# Patient Record
Sex: Female | Born: 1985 | Hispanic: No | Smoking: Never smoker
Health system: Southern US, Community
[De-identification: ages and names within clinical notes are randomized; demographics above are authoritative.]

## PROBLEM LIST (undated history)

## (undated) DIAGNOSIS — M179 Osteoarthritis of knee, unspecified: Secondary | ICD-10-CM

## (undated) DIAGNOSIS — Z862 Personal history of diseases of the blood and blood-forming organs and certain disorders involving the immune mechanism: Secondary | ICD-10-CM

## (undated) DIAGNOSIS — M1710 Unilateral primary osteoarthritis, unspecified knee: Secondary | ICD-10-CM

## (undated) HISTORY — PX: LAPAROSCOPIC GASTRIC SLEEVE RESECTION: SHX5895

---

## 2013-09-19 DIAGNOSIS — G4733 Obstructive sleep apnea (adult) (pediatric): Secondary | ICD-10-CM

## 2018-09-06 ENCOUNTER — Encounter (HOSPITAL_COMMUNITY): Payer: Self-pay | Admitting: Advanced Practice Midwife

## 2018-09-06 ENCOUNTER — Inpatient Hospital Stay (HOSPITAL_COMMUNITY)
Admission: AD | Admit: 2018-09-06 | Discharge: 2018-09-06 | Disposition: A | Discharge status: Home / Self Care | Payer: Self-pay | Attending: Obstetrics and Gynecology | Admitting: Obstetrics and Gynecology

## 2018-09-06 ENCOUNTER — Other Ambulatory Visit: Payer: Self-pay

## 2018-09-06 DIAGNOSIS — Z975 Presence of (intrauterine) contraceptive device: Secondary | ICD-10-CM | POA: Insufficient documentation

## 2018-09-06 DIAGNOSIS — N923 Ovulation bleeding: Secondary | ICD-10-CM | POA: Insufficient documentation

## 2018-09-06 DIAGNOSIS — T8383XA Hemorrhage of genitourinary prosthetic devices, implants and grafts, initial encounter: Secondary | ICD-10-CM

## 2018-09-06 DIAGNOSIS — IMO0001 Reserved for inherently not codable concepts without codable children: Secondary | ICD-10-CM

## 2018-09-06 LAB — POCT PREGNANCY, URINE: Preg Test, Ur: NEGATIVE

## 2018-09-06 NOTE — MAU Provider Note (Signed)
  History     CSN: [id number]  Arrival date and time: 09/06/18 1648   First Provider Initiated Contact with Patient 09/06/18 1750      Chief Complaint  Patient presents with  . Pelvic Pain  . ? IUD placement   Danielle [name] is a 33 y.o. who presents today for check up on her IUD. She states that she had a normal period on 08/20/2018, and then started spotting on 09/03/2018. She cannot feel her IUD strings now. So she is worried that she has displaced her IUD. She went to a non- [facility] and they sent her here for evaluation.    OB History   No obstetric history on file.     History reviewed. No pertinent past medical history.  History reviewed. No pertinent surgical history.  History reviewed. No pertinent family history.  Social History   Tobacco Use  . Smoking status: Not on file  Substance Use Topics  . Alcohol use: Not on file  . Drug use: Not on file    Allergies: Not on File  No medications prior to admission.    Review of Systems  All other systems reviewed and are negative.  Physical Exam   Blood pressure (!) 127/57, pulse 85, temperature 98.6 F (37 C), temperature source Oral, resp. rate 18, weight (!) 141.4 kg, last menstrual period 08/15/2018, SpO2 100 %.  Physical Exam  Nursing note and vitals reviewed. Constitutional: She is oriented to person, place, and time. She appears well-developed and well-nourished. No distress.  HENT:  Head: Normocephalic.  Cardiovascular: Normal rate.  Respiratory: Effort normal.  Musculoskeletal: Normal range of motion.  Neurological: She is alert and oriented to person, place, and time.   Results for orders placed or performed during the hospital encounter of 09/06/18 (from the past 24 hour(s))  Pregnancy, urine POC     Status: None   Collection Time: 09/06/18  5:44 PM  Result Value Ref Range   Preg Test, Ur NEGATIVE NEGATIVE    MAU Course  Procedures  MDM   Assessment and Plan   1.  Intermenstrual spotting due to intrauterine device (IUD), initial encounter [facility])    Patient given info for the clinic and advised to call for an appointment so that we can evaluate her IUD and bleeding.   Marcille Buffy DNP, CNM  09/06/18  6:13 PM

## 2018-09-06 NOTE — MAU Note (Signed)
Neg preg test here.  Needs list of GYN practices.

## 2018-09-06 NOTE — MAU Note (Signed)
Sometime 2nd or 3rd wk in July.  Slipped in the tub, busted her lip, knee and hit pelvis area.  There was a small lump on the left side, but that has gone away.   Had her period, .Ended around 8/4.  About 4 days ago she started spotting. This is between cycles, never had spotting like this before. Tried to reach in and feel the strings, was unable to feel and it was uncomfortable.  Has been able to feel strings in the past.  IUD was placed in [place], just moved here beginning of July.

## 2018-09-06 NOTE — Discharge Instructions (Signed)
Abnormal Uterine Bleeding Abnormal uterine bleeding is unusual bleeding from the uterus. It includes:  Bleeding or spotting between periods.  Bleeding after sex.  Bleeding that is heavier than normal.  Periods that last longer than usual.  Bleeding after menopause. Abnormal uterine bleeding can affect women at various stages in life, including teenagers, women in their reproductive years, pregnant women, and women who have reached menopause. Common causes of abnormal uterine bleeding include:  Pregnancy.  Growths of tissue (polyps).  A noncancerous tumor in the uterus (fibroid).  Infection.  Cancer.  Hormonal imbalances. Any type of abnormal bleeding should be evaluated by a health care provider. Many cases are minor and simple to treat, while others are more serious. Treatment will depend on the cause of the bleeding. Follow these instructions at home:  Monitor your condition for any changes.  Do not use tampons, douche, or have sex if told by your health care provider.  Change your pads often.  Get regular exams that include pelvic exams and cervical cancer screening.  Keep all follow-up visits as told by your health care provider. This is important. Contact a health care provider if:  Your bleeding lasts for more than one week.  You feel dizzy at times.  You feel nauseous or you vomit. Get help right away if:  You pass out.  Your bleeding soaks through a pad every hour.  You have abdominal pain.  You have a fever.  You become sweaty or weak.  You pass large blood clots from your vagina. Summary  Abnormal uterine bleeding is unusual bleeding from the uterus.  Any type of abnormal bleeding should be evaluated by a health care provider. Many cases are minor and simple to treat, while others are more serious.  Treatment will depend on the cause of the bleeding. This information is not intended to replace advice given to you by your health care provider.  Make sure you discuss any questions you have with your health care provider. Document Released: 01/02/2005 Document Revised: 04/11/2017 Document Reviewed: 02/04/2016 Elsevier Patient Education  2020 Elsevier Inc.  

## 2018-09-09 ENCOUNTER — Telehealth: Payer: Self-pay | Admitting: Advanced Practice Midwife

## 2018-09-09 NOTE — Telephone Encounter (Signed)
Patient called in stating she needs an appointment to be seen. Patient stated she went to MAU but was not able to be seen because it was for pregnant women. Patient stated she was an IUD that she can feel since she had a fall in the shower. Spoke with Nira Conn who seen the patient in MAU and she stated that the appointment can be scheduled with out next available. Patient was scheduled for 9/17 @ 8:15. Patient verbalized understanding.

## 2018-10-02 ENCOUNTER — Telehealth: Payer: Self-pay | Admitting: Obstetrics & Gynecology

## 2018-10-02 ENCOUNTER — Telehealth: Payer: Self-pay | Admitting: *Deleted

## 2018-10-02 NOTE — Telephone Encounter (Signed)
Called the patient to inform of the upcoming appointment. Left a detailed voicemail inform the patient if she has been diagnosed with covid, in close contact with someone who has had covid, or experienced any flu-like symptoms in the past 14 days please give our office a call to reschedule. Also notified due to Covid19 restriction no children or visitors are allowed. °

## 2018-10-03 ENCOUNTER — Encounter: Payer: Self-pay | Admitting: Obstetrics & Gynecology

## 2018-10-03 NOTE — Telephone Encounter (Signed)
Opened in error

## 2019-02-05 ENCOUNTER — Other Ambulatory Visit: Payer: Self-pay

## 2019-02-05 ENCOUNTER — Emergency Department (HOSPITAL_COMMUNITY)
Admission: EM | Admit: 2019-02-05 | Discharge: 2019-02-06 | Discharge status: Against Medical Advice | Payer: Medicaid Other | Attending: Certified Nurse Midwife | Admitting: Certified Nurse Midwife

## 2019-02-05 ENCOUNTER — Encounter (HOSPITAL_COMMUNITY): Payer: Self-pay | Admitting: Emergency Medicine

## 2019-02-05 DIAGNOSIS — O99011 Anemia complicating pregnancy, first trimester: Secondary | ICD-10-CM | POA: Insufficient documentation

## 2019-02-05 DIAGNOSIS — O209 Hemorrhage in early pregnancy, unspecified: Secondary | ICD-10-CM | POA: Diagnosis not present

## 2019-02-05 DIAGNOSIS — D560 Alpha thalassemia: Secondary | ICD-10-CM | POA: Insufficient documentation

## 2019-02-05 DIAGNOSIS — Z30432 Encounter for removal of intrauterine contraceptive device: Secondary | ICD-10-CM | POA: Diagnosis not present

## 2019-02-05 DIAGNOSIS — O3680X0 Pregnancy with inconclusive fetal viability, not applicable or unspecified: Secondary | ICD-10-CM | POA: Insufficient documentation

## 2019-02-05 DIAGNOSIS — Z3A01 Less than 8 weeks gestation of pregnancy: Secondary | ICD-10-CM | POA: Diagnosis not present

## 2019-02-05 LAB — BASIC METABOLIC PANEL
Anion gap: 7 (ref 5–15)
BUN: 10 mg/dL (ref 6–20)
CO2: 27 mmol/L (ref 22–32)
Calcium: 8.9 mg/dL (ref 8.9–10.3)
Chloride: 103 mmol/L (ref 98–111)
Creatinine, Ser: 0.7 mg/dL (ref 0.44–1.00)
GFR calc Af Amer: >60 mL/min (ref >60)
GFR calc non Af Amer: >60 mL/min (ref >60)
Glucose, Bld: 89 mg/dL (ref 70–99)
Potassium: 4.5 mmol/L (ref 3.5–5.1)
Sodium: 137 mmol/L (ref 135–145)

## 2019-02-05 LAB — CBC WITH DIFFERENTIAL/PLATELET
Abs Immature Granulocytes: 0.03 10*3/uL (ref 0.00–0.07)
Basophils Absolute: 0.1 10*3/uL (ref 0.0–0.1)
Basophils Relative: 1 %
Eosinophils Absolute: 0 10*3/uL (ref 0.0–0.5)
Eosinophils Relative: 1 %
HCT: 31.8 % — ABNORMAL LOW (ref 36.0–46.0)
Hemoglobin: 9.4 g/dL — ABNORMAL LOW (ref 12.0–15.0)
Immature Granulocytes: 0 %
Lymphocytes Relative: 19 %
Lymphs Abs: 1.7 10*3/uL (ref 0.7–4.0)
MCH: 24 pg — ABNORMAL LOW (ref 26.0–34.0)
MCHC: 29.6 g/dL — ABNORMAL LOW (ref 30.0–36.0)
MCV: 81.1 fL (ref 80.0–100.0)
Monocytes Absolute: 0.7 10*3/uL (ref 0.1–1.0)
Monocytes Relative: 8 %
Neutro Abs: 6.1 10*3/uL (ref 1.7–7.7)
Neutrophils Relative %: 71 %
Platelets: 310 10*3/uL (ref 150–400)
RBC: 3.92 MIL/uL (ref 3.87–5.11)
RDW: 12.5 % (ref 11.5–15.5)
WBC: 8.5 10*3/uL (ref 4.0–10.5)
nRBC: 0 % (ref 0.0–0.2)

## 2019-02-05 LAB — I-STAT BETA HCG BLOOD, ED (MC, WL, AP ONLY): I-stat hCG, quantitative: 195.6 m[IU]/mL — ABNORMAL HIGH (ref <5)

## 2019-02-05 NOTE — ED Triage Notes (Signed)
Patient requesting her IUD removed due to intermittent spotting since last year , unknown LMP , patient added chronic left knee pain for several years /ambulatory .

## 2019-02-06 ENCOUNTER — Inpatient Hospital Stay (HOSPITAL_COMMUNITY): Payer: Medicaid Other

## 2019-02-06 ENCOUNTER — Other Ambulatory Visit: Payer: Self-pay

## 2019-02-06 ENCOUNTER — Inpatient Hospital Stay (EMERGENCY_DEPARTMENT_HOSPITAL)
Admission: AD | Admit: 2019-02-06 | Discharge: 2019-02-06 | Disposition: A | Discharge status: Home / Self Care | Payer: Medicaid Other | Source: Home / Self Care | Attending: Obstetrics and Gynecology | Admitting: Obstetrics and Gynecology

## 2019-02-06 ENCOUNTER — Encounter (HOSPITAL_COMMUNITY): Payer: Self-pay | Admitting: Obstetrics and Gynecology

## 2019-02-06 DIAGNOSIS — Z3A01 Less than 8 weeks gestation of pregnancy: Secondary | ICD-10-CM | POA: Diagnosis not present

## 2019-02-06 DIAGNOSIS — O2630 Retained intrauterine contraceptive device in pregnancy, unspecified trimester: Secondary | ICD-10-CM

## 2019-02-06 DIAGNOSIS — O3680X0 Pregnancy with inconclusive fetal viability, not applicable or unspecified: Secondary | ICD-10-CM

## 2019-02-06 DIAGNOSIS — O209 Hemorrhage in early pregnancy, unspecified: Secondary | ICD-10-CM

## 2019-02-06 DIAGNOSIS — Z30432 Encounter for removal of intrauterine contraceptive device: Secondary | ICD-10-CM | POA: Diagnosis not present

## 2019-02-06 DIAGNOSIS — Z6791 Unspecified blood type, Rh negative: Secondary | ICD-10-CM

## 2019-02-06 DIAGNOSIS — Z331 Pregnant state, incidental: Secondary | ICD-10-CM

## 2019-02-06 DIAGNOSIS — O99011 Anemia complicating pregnancy, first trimester: Secondary | ICD-10-CM

## 2019-02-06 DIAGNOSIS — O26891 Other specified pregnancy related conditions, first trimester: Secondary | ICD-10-CM

## 2019-02-06 DIAGNOSIS — T8331XA Breakdown (mechanical) of intrauterine contraceptive device, initial encounter: Secondary | ICD-10-CM

## 2019-02-06 HISTORY — DX: Osteoarthritis of knee, unspecified: M17.9

## 2019-02-06 HISTORY — DX: Personal history of diseases of the blood and blood-forming organs and certain disorders involving the immune mechanism: Z86.2

## 2019-02-06 HISTORY — DX: Unilateral primary osteoarthritis, unspecified knee: M17.10

## 2019-02-06 LAB — URINALYSIS, ROUTINE W REFLEX MICROSCOPIC
Bilirubin Urine: NEGATIVE
Glucose, UA: NEGATIVE mg/dL
Hgb urine dipstick: NEGATIVE
Ketones, ur: NEGATIVE mg/dL
Leukocytes,Ua: NEGATIVE
Nitrite: NEGATIVE
Protein, ur: NEGATIVE mg/dL
Specific Gravity, Urine: 1.01 (ref 1.005–1.030)
pH: 6 (ref 5.0–8.0)

## 2019-02-06 LAB — HIV ANTIBODY (ROUTINE TESTING W REFLEX): HIV Screen 4th Generation wRfx: NONREACTIVE

## 2019-02-06 LAB — WET PREP, GENITAL
Sperm: NONE SEEN
Trich, Wet Prep: NONE SEEN
Yeast Wet Prep HPF POC: NONE SEEN

## 2019-02-06 LAB — HCG, QUANTITATIVE, PREGNANCY: hCG, Beta Chain, Quant, S: 247 m[IU]/mL — ABNORMAL HIGH (ref <5)

## 2019-02-06 LAB — ABO/RH: ABO/RH(D): B NEG

## 2019-02-06 MED ORDER — RHO D IMMUNE GLOBULIN 1500 UNIT/2ML IJ SOSY
300.0000 ug | PREFILLED_SYRINGE | Freq: Once | INTRAMUSCULAR | Status: AC
  Administered 2019-02-06: 09:00:00 300 ug via INTRAMUSCULAR
  Filled 2019-02-06: qty 2

## 2019-02-06 NOTE — Discharge Instructions (Signed)
Vaginal Bleeding During Pregnancy, First Trimester  A small amount of bleeding from the vagina (spotting) is relatively common during early pregnancy. It usually stops on its own. Various things may cause bleeding or spotting during early pregnancy. Some bleeding may be related to the pregnancy, and some may not. In many cases, the bleeding is normal and is not a problem. However, bleeding can also be a sign of something serious. Be sure to tell your health care provider about any vaginal bleeding right away. Some possible causes of vaginal bleeding during the first trimester include:  Infection or inflammation of the cervix.  Growths (polyps) on the cervix.  Miscarriage or threatened miscarriage.  Pregnancy tissue developing outside of the uterus (ectopic pregnancy).  A mass of tissue developing in the uterus due to an egg being fertilized incorrectly (molar pregnancy). Follow these instructions at home: Activity  Follow instructions from your health care provider about limiting your activity. Ask what activities are safe for you.  If needed, make plans for someone to help with your regular activities.  Do not have sex or orgasms until your health care provider says that this is safe. General instructions  Take over-the-counter and prescription medicines only as told by your health care provider.  Pay attention to any changes in your symptoms.  Do not use tampons or douche.  Write down how many pads you use each day, how often you change pads, and how soaked (saturated) they are.  If you pass any tissue from your vagina, save the tissue so you can show it to your health care provider.  Keep all follow-up visits as told by your health care provider. This is important. Contact a health care provider if:  You have vaginal bleeding during any part of your pregnancy.  You have cramps or labor pains.  You have a fever. Get help right away if:  You have severe cramps in your  back or abdomen.  You pass large clots or a large amount of tissue from your vagina.  Your bleeding increases.  You feel light-headed or weak, or you faint.  You have chills.  You are leaking fluid or have a gush of fluid from your vagina. Summary  A small amount of bleeding (spotting) from the vagina is relatively common during early pregnancy.  Various things may cause bleeding or spotting in early pregnancy.  Be sure to tell your health care provider about any vaginal bleeding right away. This information is not intended to replace advice given to you by your health care provider. Make sure you discuss any questions you have with your health care provider. Document Revised: 04/23/2018 Document Reviewed: 04/06/2016 Elsevier Patient Education  2020 Elsevier Inc.  

## 2019-02-06 NOTE — MAU Provider Note (Addendum)
Chief Complaint: pregnancy with iud   First Provider Initiated Contact with Patient 02/06/19 0703        SUBJECTIVE HPI: Danielle [name] is a 34 y.o. [id number] at [redacted]w[redacted]d by LMP who presents to maternity admissions reporting positive pregnancy test and bleeding off and on with presence of Paragard IUD.  Wants it removed, has appt for February.  Was planning pregnancy in February anyway. She denies vaginal itching/burning, urinary symptoms, h/a, dizziness, n/v, or fever/chills.    Vaginal Bleeding The patient's primary symptoms include vaginal bleeding. The patient's pertinent negatives include no genital itching, genital lesions, genital odor or pelvic pain. This is a new problem. The current episode started 1 to 4 weeks ago. The problem occurs intermittently. The problem has been unchanged. The patient is experiencing no pain. She is pregnant. Pertinent negatives include no abdominal pain, chills, constipation, diarrhea, fever, nausea or vomiting. The vaginal discharge was bloody. The vaginal bleeding is spotting. She has not been passing clots. She has not been passing tissue. Nothing aggravates the symptoms. She has tried nothing for the symptoms.   RN note: Was seen in ED last night and did blood work but left due to wait. Did not know blood work showed I was pregnant. I did upt last night and was postive. Have IUD in which is why I went to ED. Has been irritating me and wanted it out. Have paragard in and usually have periods. LMP 12/14 and was normal. Spotting since 12/31 and had something like light period 1/12. Have had IUD about 45months. Placed in [place], [place]  Past Medical History:  Diagnosis Date  . Degenerative arthritis of knee    left   . Hx of alpha thalassemia    Past Surgical History:  Procedure Laterality Date  . CESAREAN SECTION    . LAPAROSCOPIC GASTRIC SLEEVE RESECTION     Social History   Socioeconomic History  . Marital status: Single    Spouse name: Not on file  .  Number of children: Not on file  . Years of education: Not on file  . Highest education level: Not on file  Occupational History  . Not on file  Tobacco Use  . Smoking status: Never Smoker  . Smokeless tobacco: Never Used  Substance and Sexual Activity  . Alcohol use: Never  . Drug use: Never  . Sexual activity: Never  Other Topics Concern  . Not on file  Social History Narrative  . Not on file   Social Determinants of Health   Financial Resource Strain:   . Difficulty of Paying Living Expenses: Not on file  Food Insecurity:   . Worried About Charity fundraiser in the Last Year: Not on file  . Ran Out of Food in the Last Year: Not on file  Transportation Needs:   . Lack of Transportation (Medical): Not on file  . Lack of Transportation (Non-Medical): Not on file  Physical Activity:   . Days of Exercise per Week: Not on file  . Minutes of Exercise per Session: Not on file  Stress:   . Feeling of Stress : Not on file  Social Connections:   . Frequency of Communication with Friends and Family: Not on file  . Frequency of Social Gatherings with Friends and Family: Not on file  . Attends Religious Services: Not on file  . Active Member of Clubs or Organizations: Not on file  . Attends Archivist Meetings: Not on file  . Marital Status: Not  on file  Intimate Partner Violence:   . Fear of Current or Ex-Partner: Not on file  . Emotionally Abused: Not on file  . Physically Abused: Not on file  . Sexually Abused: Not on file   No current facility-administered medications on file prior to encounter.   Current Outpatient Medications on File Prior to Encounter  Medication Sig Dispense Refill  . acetaminophen (TYLENOL) 500 MG tablet Take 500 mg by mouth every 6 (six) hours as needed.    . Prenatal Vit-Fe Fumarate-FA (PRENATAL MULTIVITAMIN) TABS tablet Take 1 tablet by mouth daily at 12 noon.     No Known Allergies  I have reviewed patient's Past Medical Hx, Surgical  Hx, Family Hx, Social Hx, medications and allergies.   ROS:  Review of Systems  Constitutional: Negative for chills and fever.  Gastrointestinal: Negative for abdominal pain, constipation, diarrhea, nausea and vomiting.  Genitourinary: Positive for vaginal bleeding. Negative for pelvic pain.   Review of Systems  Other systems negative   Physical Exam  Physical Exam Patient Vitals for the past 24 hrs:  BP Temp Temp src Pulse Resp Height Weight  02/06/19 0703 (!) 100/46 -- -- 77 -- -- --  02/06/19 0701 -- 98.1 F (36.7 C) Oral -- 18 -- --  02/06/19 0630 124/73 -- -- 83 -- -- --  02/06/19 0628 -- 98.6 F (37 C) -- -- 18 -- --  02/06/19 7253 -- -- -- -- -- 5\' 4"  (1.626 m) (!) 147 kg   Constitutional: Well-developed, well-nourished female in no acute distress.  Cardiovascular: normal rate Respiratory: normal effort GI: Abd soft, non-tender. Pos BS x 4 MS: Extremities nontender, no edema, normal ROM Neurologic: Alert and oriented x 4.  GU: Neg CVAT.  PELVIC EXAM: Blind swabs done, pelvic exam deferred for now as she is on the way to department   LAB RESULTS  --/--/B NEG Performed at [facility], [address], [place], [place] , B NEG (01/21 0644)  IMAGING 08-17-1998 OB Comp Less 14 Wks  Result Date: 02/06/2019 CLINICAL DATA:  Vaginal bleeding.  Positive beta HCG EXAM: OBSTETRIC <14 WK 02/08/2019 AND TRANSVAGINAL OB US TECHNIQUE: Both transabdominal and transvaginal ultrasound examinations were performed for complete evaluation of the gestation as well as the maternal uterus, adnexal regions, and pelvic cul-de-sac. Transvaginal technique was performed to assess early pregnancy. COMPARISON:  None. FINDINGS: Intrauterine gestational sac: Probable tiny gestational sac within the endometrium Yolk sac:  Not visualized Embryo:  Not visualized Cardiac Activity: Not visualized MSD: Just over 1 mm consistent with a less than [redacted] week gestation Subchorionic hemorrhage:  None  visualized. Maternal uterus/adnexae: Intrauterine device is positioned at the uterus-cervix junction. Atrium is mildly thickened. Right ovary measures 2.7 x 2.3 x 1.8 cm. Left ovary measures 2.2 x 5.7 x 3.5 cm. No free pelvic fluid. IMPRESSION: 1. Probable early intrauterine gestational sac, but no yolk sac, fetal pole, or cardiac activity yet visualized. Recommend follow-up quantitative B-HCG levels and follow-up US in 14 days to assess viability. This recommendation follows SRU consensus guidelines: Diagnostic Criteria for Nonviable Pregnancy Early in the First Trimester. [place] Med 20132014. Based on suspected gestational sac size, estimated gestational age is less than 5 weeks. 2. Intrauterine device at the uterus-cervix junction. Endometrium mildly thickened. 3.  No appreciable extrauterine pelvic mass or free fluid. Electronically Signed   By: ; [id number] III M.D.   On: 02/06/2019 08:02   02/08/2019 OB Transvaginal  Result Date:  02/06/2019 CLINICAL DATA:  Vaginal bleeding.  Positive beta HCG EXAM: OBSTETRIC <14 WK [place] AND TRANSVAGINAL OB US TECHNIQUE: Both transabdominal and transvaginal ultrasound examinations were performed for complete evaluation of the gestation as well as the maternal uterus, adnexal regions, and pelvic cul-de-sac. Transvaginal technique was performed to assess early pregnancy. COMPARISON:  None. FINDINGS: Intrauterine gestational sac: Probable tiny gestational sac within the endometrium Yolk sac:  Not visualized Embryo:  Not visualized Cardiac Activity: Not visualized MSD: Just over 1 mm consistent with a less than [redacted] week gestation Subchorionic hemorrhage:  None visualized. Maternal uterus/adnexae: Intrauterine device is positioned at the uterus-cervix junction. Atrium is mildly thickened. Right ovary measures 2.7 x 2.3 x 1.8 cm. Left ovary measures 2.2 x 5.7 x 3.5 cm. No free pelvic fluid. IMPRESSION: 1. Probable early intrauterine gestational sac, but no yolk sac, fetal  pole, or cardiac activity yet visualized. Recommend follow-up quantitative B-HCG levels and follow-up US in 14 days to assess viability. This recommendation follows SRU consensus guidelines: Diagnostic Criteria for Nonviable Pregnancy Early in the First Trimester. Malva Limes Med 2013; 035:5974-16. Based on suspected gestational sac size, estimated gestational age is less than 5 weeks. 2. Intrauterine device at the uterus-cervix junction. Endometrium mildly thickened. 3.  No appreciable extrauterine pelvic mass or free fluid. Electronically Signed   By: Bretta Bang III M.D.   On: 02/06/2019 08:02   MAU Management/MDM: Ordered usual first trimester r/o ectopic labs.   Pelvic exam and cultures done Will check baseline Ultrasound to rule out ectopic and locate IUD.  This bleeding/pain can represent a normal pregnancy with bleeding, spontaneous abortion or even an ectopic which can be life-threatening.  The process as listed above helps to determine which of these is present.  Report given to oncoming provider Patient wants IUD pulled  Wynelle Bourgeois CNM, MSN Certified Nurse-Midwife 02/06/2019  9:26 AM   Assumed care of patient. IUGS seen on [place] but no YS or FP, findings likely indicate early pregnancy, but ectopic pregnancy or failed pregnancy cannot be r/o-discussed with pt. Will follow quant in 48 hrs. Pt desires IUD removal and I would recommend IUD removal since it's malplaced in LUS. Rhogam given.  Procedure Note: Pt consented for IUD removal. Pt placed in lithotomy position. Speculum inserted, cervix and IUD strings visualized. Strings grasped with long kelly and Paragard IUD removed intact without difficulty. Tolerated well.  A/P: Pregnancy of unknown location IUD in pregnancy IUD removal Rh negative Anemia in pregnancy  Discharge home Follow up at MAU on 1/23 for stat qhcg Ectopic/SAB precautions  Allergies as of 02/06/2019   No Known Allergies     Medication List     TAKE these medications   acetaminophen 500 MG tablet Commonly known as: TYLENOL Take 500 mg by mouth every 6 (six) hours as needed.   prenatal multivitamin Tabs tablet Take 1 tablet by mouth daily at 12 noon.       Donette Larry, CNM  02/06/2019 9:26 AM

## 2019-02-06 NOTE — MAU Note (Signed)
Was seen in ED last night and did blood work but left due to wait. Did not know blood work showed I was pregnant. I did upt last night and was postive. Have IUD in which is why I went to ED. Has been irritating me and wanted it out. Have paragard in and usually have periods. LMP 12/14 and was normal. Spotting since 12/31 and had something like light period 1/12. Have had IUD about 44months. Placed in [place], [place]

## 2019-02-07 LAB — RH IG WORKUP (INCLUDES ABO/RH)
ABO/RH(D): B NEG
Antibody Screen: NEGATIVE
Gestational Age(Wks): 2
Unit division: 0

## 2019-02-07 LAB — GC/CHLAMYDIA PROBE AMP (~~LOC~~) NOT AT ARMC
Chlamydia: NEGATIVE
Comment: NEGATIVE
Comment: NORMAL
Neisseria Gonorrhea: NEGATIVE

## 2019-02-08 ENCOUNTER — Inpatient Hospital Stay (HOSPITAL_COMMUNITY)
Admission: AD | Admit: 2019-02-08 | Discharge: 2019-02-08 | Disposition: A | Discharge status: Home / Self Care | Payer: Medicaid Other | Attending: Family Medicine | Admitting: Family Medicine

## 2019-02-08 ENCOUNTER — Other Ambulatory Visit: Payer: Self-pay

## 2019-02-08 DIAGNOSIS — O3680X0 Pregnancy with inconclusive fetal viability, not applicable or unspecified: Secondary | ICD-10-CM | POA: Insufficient documentation

## 2019-02-08 LAB — HCG, QUANTITATIVE, PREGNANCY: hCG, Beta Chain, Quant, S: 389 m[IU]/mL — ABNORMAL HIGH (ref <5)

## 2019-02-08 NOTE — MAU Provider Note (Signed)
Ms. Danielle [name]  is a 34 y.o. [phone] at unknown who presents to MAU today for follow-up quant hCG after 48 hours. The patient was seen in MAU on 02/06/19 and had quant hCG of 247 and US showed IUGS but no YS or FP. IUD was also seen and malposition therefore removed. She denies pain and reports small amt of pink spotting.   OB History  Gravida Para Term Preterm AB Living  4 2 1 1 1 2   SAB TAB Ectopic Multiple Live Births  1       2    # Outcome Date GA Lbr Len/2nd Weight Sex Delivery Anes PTL Lv  4 Current           3 Term      CS-Unspec   LIV  2 Preterm      CS-Unspec   LIV  1 SAB             Obstetric Comments  2nd pregnancy delivered at 36 weeks  Prior deliveries in     Past Medical History:  Diagnosis Date  . Degenerative arthritis of knee    left   . Hx of alpha thalassemia     ROS: + VB no pain  BP 124/68 (BP Location: Right Arm)   Pulse 81   Temp 98.3 F (36.8 C) (Oral)   Resp 16   LMP 01/28/2019   SpO2 100% Comment: room air  CONSTITUTIONAL: Well-developed, well-nourished female in no acute distress.  MUSCULOSKELETAL: Normal range of motion.  CARDIOVASCULAR: Regular heart rate RESPIRATORY: Normal effort NEUROLOGICAL: Alert and oriented to person, place, and time.  SKIN: Not diaphoretic. No erythema. No pallor. PSYCH: Normal mood and affect. Normal behavior. Normal judgment and thought content.  Results for orders placed or performed during the hospital encounter of 02/08/19 (from the past 24 hour(s))  hCG, quantitative, pregnancy     Status: Abnormal   Collection Time: 02/08/19  9:34 AM  Result Value Ref Range   hCG, Beta Chain, Quant, S 389 (H) <5 mIU/mL    MDM: Almost 60% rise in quant. Not having pain. Will repeat in 2 days. Stable for discharge home.   A: 1. Pregnancy, location unknown    P: Discharge home First trimester/ectopic precautions discussed Patient will return for follow-up quant HCG in [facility] on 02/10/19  @0900  Patient may return to MAU as needed or if her condition were to change or worsen   02/12/19, 02/08/2019 11:12 AM

## 2019-02-08 NOTE — MAU Note (Signed)
Danielle [name] is a 34 y.o. at [redacted]w[redacted]d here in MAU reporting: here for follow up hcg. Denies pain. Having some pink spotting when she wipes. Had some nausea yesterday.  Pain score: 0/10  Vitals:   02/08/19 0839  BP: 124/68  Pulse: 81  Resp: 16  Temp: 98.3 F (36.8 C)  SpO2: 100%     Lab orders placed from triage: hcg order released

## 2019-02-08 NOTE — Discharge Instructions (Signed)

## 2019-02-10 ENCOUNTER — Ambulatory Visit (INDEPENDENT_AMBULATORY_CARE_PROVIDER_SITE_OTHER): Payer: Medicaid Other | Admitting: General Practice

## 2019-02-10 ENCOUNTER — Inpatient Hospital Stay (HOSPITAL_COMMUNITY)
Admission: AD | Admit: 2019-02-10 | Discharge: 2019-02-10 | Disposition: A | Discharge status: Home / Self Care | Payer: Medicaid Other | Attending: Obstetrics & Gynecology | Admitting: Obstetrics & Gynecology

## 2019-02-10 ENCOUNTER — Other Ambulatory Visit: Payer: Self-pay

## 2019-02-10 DIAGNOSIS — O3680X0 Pregnancy with inconclusive fetal viability, not applicable or unspecified: Secondary | ICD-10-CM | POA: Diagnosis not present

## 2019-02-10 DIAGNOSIS — R110 Nausea: Secondary | ICD-10-CM | POA: Diagnosis not present

## 2019-02-10 DIAGNOSIS — O008 Other ectopic pregnancy without intrauterine pregnancy: Secondary | ICD-10-CM | POA: Diagnosis not present

## 2019-02-10 DIAGNOSIS — O009 Unspecified ectopic pregnancy without intrauterine pregnancy: Secondary | ICD-10-CM | POA: Diagnosis present

## 2019-02-10 DIAGNOSIS — O26891 Other specified pregnancy related conditions, first trimester: Secondary | ICD-10-CM | POA: Diagnosis not present

## 2019-02-10 DIAGNOSIS — Z3A01 Less than 8 weeks gestation of pregnancy: Secondary | ICD-10-CM | POA: Insufficient documentation

## 2019-02-10 DIAGNOSIS — O2630 Retained intrauterine contraceptive device in pregnancy, unspecified trimester: Secondary | ICD-10-CM

## 2019-02-10 LAB — CBC WITH DIFFERENTIAL/PLATELET
Abs Immature Granulocytes: 0.02 10*3/uL (ref 0.00–0.07)
Basophils Absolute: 0 10*3/uL (ref 0.0–0.1)
Basophils Relative: 1 %
Eosinophils Absolute: 0.1 10*3/uL (ref 0.0–0.5)
Eosinophils Relative: 1 %
HCT: 30.2 % — ABNORMAL LOW (ref 36.0–46.0)
Hemoglobin: 9.4 g/dL — ABNORMAL LOW (ref 12.0–15.0)
Immature Granulocytes: 0 %
Lymphocytes Relative: 24 %
Lymphs Abs: 2 10*3/uL (ref 0.7–4.0)
MCH: 24.6 pg — ABNORMAL LOW (ref 26.0–34.0)
MCHC: 31.1 g/dL (ref 30.0–36.0)
MCV: 79.1 fL — ABNORMAL LOW (ref 80.0–100.0)
Monocytes Absolute: 0.7 10*3/uL (ref 0.1–1.0)
Monocytes Relative: 9 %
Neutro Abs: 5.3 10*3/uL (ref 1.7–7.7)
Neutrophils Relative %: 65 %
Platelets: 286 10*3/uL (ref 150–400)
RBC: 3.82 MIL/uL — ABNORMAL LOW (ref 3.87–5.11)
RDW: 12.6 % (ref 11.5–15.5)
WBC: 8.1 10*3/uL (ref 4.0–10.5)
nRBC: 0 % (ref 0.0–0.2)

## 2019-02-10 LAB — COMPREHENSIVE METABOLIC PANEL
ALT: 12 U/L (ref 0–44)
AST: 16 U/L (ref 15–41)
Albumin: 3.2 g/dL — ABNORMAL LOW (ref 3.5–5.0)
Alkaline Phosphatase: 45 U/L (ref 38–126)
Anion gap: 8 (ref 5–15)
BUN: 6 mg/dL (ref 6–20)
CO2: 28 mmol/L (ref 22–32)
Calcium: 9.4 mg/dL (ref 8.9–10.3)
Chloride: 102 mmol/L (ref 98–111)
Creatinine, Ser: 0.58 mg/dL (ref 0.44–1.00)
GFR calc Af Amer: >60 mL/min (ref >60)
GFR calc non Af Amer: >60 mL/min (ref >60)
Glucose, Bld: 83 mg/dL (ref 70–99)
Potassium: 4.1 mmol/L (ref 3.5–5.1)
Sodium: 138 mmol/L (ref 135–145)
Total Bilirubin: 0.3 mg/dL (ref 0.3–1.2)
Total Protein: 6.5 g/dL (ref 6.5–8.1)

## 2019-02-10 LAB — BETA HCG QUANT (REF LAB): hCG Quant: 513 m[IU]/mL

## 2019-02-10 MED ORDER — METHOTREXATE FOR ECTOPIC PREGNANCY
50.0000 mg/m2 | Freq: Once | INTRAMUSCULAR | Status: AC
  Administered 2019-02-10: 130 mg via INTRAMUSCULAR
  Filled 2019-02-10: qty 2

## 2019-02-10 NOTE — MAU Provider Note (Signed)
History     CSN: [id number]  Arrival date and time: 02/10/19 1405  CC;  Comes to MAU for methotrexate injection    Chief Complaint  Patient presents with  . methotrexate   HPI Danielle [name] 34 y.o. [redacted]w[redacted]d Dr. Adrian Blackwater called report to provider from the office.  He has talked with the patient and she is coming in for inappropriately rising quants and will get methotrexate.  OB History    Gravida  4   Para  2   Term  1   Preterm  1   AB  1   Living  2     SAB  1   TAB      Ectopic      Multiple      Live Births  2        Obstetric Comments  2nd pregnancy delivered at 56 weeks Prior deliveries in [place]         Past Medical History:  Diagnosis Date  . Degenerative arthritis of knee    left   . Hx of alpha thalassemia     Past Surgical History:  Procedure Laterality Date  . CESAREAN SECTION    . LAPAROSCOPIC GASTRIC SLEEVE RESECTION      Family History  Problem Relation Age of Onset  . Leukemia Mother   . Renal Disease Mother   . Diabetes Father   . Hypertension Father   . Prostate cancer Father   . Dementia Father     Social History   Tobacco Use  . Smoking status: Never Smoker  . Smokeless tobacco: Never Used  Substance Use Topics  . Alcohol use: Never  . Drug use: Never    Allergies: No Known Allergies  Medications Prior to Admission  Medication Sig Dispense Refill Last Dose  . acetaminophen (TYLENOL) 500 MG tablet Take 500 mg by mouth every 6 (six) hours as needed.     . Prenatal Vit-Fe Fumarate-FA (PRENATAL MULTIVITAMIN) TABS tablet Take 1 tablet by mouth daily at 12 noon.       Review of Systems  Constitutional: Negative for fever.  Respiratory: Negative for cough and shortness of breath.   Gastrointestinal: Negative for abdominal pain, nausea and vomiting.  Genitourinary: Negative for dysuria, vaginal bleeding and vaginal discharge.   Physical Exam   Blood pressure 135/68, pulse 81, temperature 98.4 F  (36.9 C), temperature source Oral, resp. rate 18, height 5\' 4"  (1.626 m), weight (!) 148.3 kg, last menstrual period 01/28/2019.  Physical Exam  Nursing note and vitals reviewed. Constitutional: She is oriented to person, place, and time. She appears well-developed and well-nourished.  HENT:  Head: Normocephalic.  Eyes: EOM are normal.  Cardiovascular: Normal rate.  Respiratory: Effort normal.  Musculoskeletal:        General: Normal range of motion.     Cervical back: Neck supple.  Neurological: She is alert and oriented to person, place, and time.  Skin: Skin is warm and dry.  Psychiatric: She has a normal mood and affect.    MAU Course  Procedures  MDM Reviewed diagnosis of inappropriate rise of BHCG and treatment of ectopic pregnancy.  Client agrees to methotrexate as treatment.  Labs drawn and reviewed.  Methotrexate ordered.  Assessment and Plan  Ectopic pregnancy treated with methotrexate  Plan Will follow up for day 4 BHCG at [facility] office and appointment made. Will follow up for Day 7 BHCG at MAU - instructions given to client to come in the morning  on Friday. Advised she will need weekly quants at the [facility] office after these to follow pregnancy hormone levels to zero. Pelvic rest reviewed with client and note to be out of work until Friday given.   L  02/10/2019, 4:18 PM

## 2019-02-10 NOTE — Progress Notes (Signed)
Discussed patient's abnormally rising HCG. Recommended Mtx. Patient tearful, but okay with injection. Will send to MAU - MAU notified. F/u on Thursday for rpt HCG. Please schedule.

## 2019-02-10 NOTE — Progress Notes (Signed)
Patient presents to office today for stat bhcg following up from MAU visits on 1/21 & 1/23. Patient reports spotting stopped shortly after IUD was removed. Denies pain today as well. Discussed with patient we are monitoring your bhcg levels today, results take approximately 2 hours to finalize and will be reviewed with a provider in the office- we will then call you with results/updated plan of care. Patient verbalized understanding and provided callback numbers [phone] and [phone].

## 2019-02-10 NOTE — MAU Note (Signed)
Sent in for Methotrexate.  Inappropriate rise in HCG

## 2019-02-10 NOTE — MAU Note (Signed)
Is tired, breasts are hurting and is nauseous.  Is peeing so much, no pain just noted increased frequency.  Saw a little pink when wiped once today, had a couple cramps earlier, none since.

## 2019-02-10 NOTE — Discharge Instructions (Signed)
Methotrexate Treatment for an Ectopic Pregnancy, Care After This sheet gives you information about how to care for yourself after your procedure. Your health care provider may also give you more specific instructions. If you have problems or questions, contact your health care provider. What can I expect after the procedure? After the procedure, it is common to have:  Abdominal cramping.  Vaginal bleeding.  Fatigue.  Nausea.  Vomiting.  Diarrhea. Blood tests will be taken at timed intervals for several days or weeks to check your pregnancy hormone levels. The blood tests will be done until the pregnancy hormone can no longer be detected in the blood. Follow these instructions at home: Activity  Do not have sex until your health care provider approves.  Limit activities that take a lot of effort as told by your health care provider. Medicines  Take over the counter and prescription medicines only as told by your health care provider.  Do not take aspirin, ibuprofen, naproxen, or any other NSAIDs.  Do not take folic acid, prenatal vitamins, or other vitamins that contain folic acid. General instructions   Do not drink alcohol.  Follow instructions from your health care provider on how and when to report any symptoms that may indicate a ruptured ectopic pregnancy.  Keep all follow-up visits as told by your health care provider. This is important. Contact a health care provider if:  You have persistent nausea and vomiting.  You have persistent diarrhea.  You are having a reaction to the medicine, such as: ? Tiredness. ? Skin rash. ? Hair loss. Get help right away if:  Your abdominal or pelvic pain gets worse.  You have more vaginal bleeding.  You feel light-headed or you faint.  You have shortness of breath.  Your heart rate increases.  You develop a cough.  You have chills.  You have a fever. Summary  After the procedure, it is common to have symptoms  of abdominal cramping, vaginal bleeding and fatigue. You may also experience other symptoms.  Blood tests will be taken at timed intervals for several days or weeks to check your pregnancy hormone levels. The blood tests will be done until the pregnancy hormone can no longer be detected in the blood.  Limit strenuous activity as told by your health care provider.  Follow instructions from your health care provider on how and when to report any symptoms that may indicate a ruptured ectopic pregnancy. This information is not intended to replace advice given to you by your health care provider. Make sure you discuss any questions you have with your health care provider. Document Revised: 12/15/2016 Document Reviewed: 02/22/2016 Elsevier Patient Education  2020 Elsevier Inc.  

## 2019-02-11 ENCOUNTER — Other Ambulatory Visit: Payer: Self-pay

## 2019-02-11 ENCOUNTER — Encounter (HOSPITAL_COMMUNITY): Payer: Self-pay | Admitting: Obstetrics & Gynecology

## 2019-02-11 ENCOUNTER — Inpatient Hospital Stay (HOSPITAL_COMMUNITY)
Admission: AD | Admit: 2019-02-11 | Discharge: 2019-02-11 | Disposition: A | Discharge status: Home / Self Care | Payer: Medicaid Other | Source: Ambulatory Visit | Attending: Obstetrics & Gynecology | Admitting: Obstetrics & Gynecology

## 2019-02-11 ENCOUNTER — Inpatient Hospital Stay (HOSPITAL_COMMUNITY): Payer: Medicaid Other

## 2019-02-11 DIAGNOSIS — Z9221 Personal history of antineoplastic chemotherapy: Secondary | ICD-10-CM

## 2019-02-11 DIAGNOSIS — O210 Mild hyperemesis gravidarum: Secondary | ICD-10-CM | POA: Diagnosis not present

## 2019-02-11 DIAGNOSIS — R1032 Left lower quadrant pain: Secondary | ICD-10-CM | POA: Insufficient documentation

## 2019-02-11 DIAGNOSIS — O008 Other ectopic pregnancy without intrauterine pregnancy: Secondary | ICD-10-CM | POA: Diagnosis not present

## 2019-02-11 DIAGNOSIS — O0281 Inappropriate change in quantitative human chorionic gonadotropin (hCG) in early pregnancy: Secondary | ICD-10-CM | POA: Diagnosis not present

## 2019-02-11 DIAGNOSIS — R0902 Hypoxemia: Secondary | ICD-10-CM | POA: Diagnosis not present

## 2019-02-11 DIAGNOSIS — R42 Dizziness and giddiness: Secondary | ICD-10-CM | POA: Diagnosis not present

## 2019-02-11 DIAGNOSIS — R52 Pain, unspecified: Secondary | ICD-10-CM | POA: Diagnosis not present

## 2019-02-11 DIAGNOSIS — O26891 Other specified pregnancy related conditions, first trimester: Secondary | ICD-10-CM | POA: Diagnosis not present

## 2019-02-11 DIAGNOSIS — I959 Hypotension, unspecified: Secondary | ICD-10-CM | POA: Diagnosis not present

## 2019-02-11 DIAGNOSIS — Z3A01 Less than 8 weeks gestation of pregnancy: Secondary | ICD-10-CM | POA: Diagnosis not present

## 2019-02-11 DIAGNOSIS — R109 Unspecified abdominal pain: Secondary | ICD-10-CM | POA: Diagnosis not present

## 2019-02-11 DIAGNOSIS — O2630 Retained intrauterine contraceptive device in pregnancy, unspecified trimester: Secondary | ICD-10-CM

## 2019-02-11 DIAGNOSIS — Z3A00 Weeks of gestation of pregnancy not specified: Secondary | ICD-10-CM | POA: Diagnosis not present

## 2019-02-11 DIAGNOSIS — R1084 Generalized abdominal pain: Secondary | ICD-10-CM | POA: Diagnosis not present

## 2019-02-11 LAB — CBC
HCT: 31.8 % — ABNORMAL LOW (ref 36.0–46.0)
Hemoglobin: 9.8 g/dL — ABNORMAL LOW (ref 12.0–15.0)
MCH: 24.6 pg — ABNORMAL LOW (ref 26.0–34.0)
MCHC: 30.8 g/dL (ref 30.0–36.0)
MCV: 79.9 fL — ABNORMAL LOW (ref 80.0–100.0)
Platelets: 298 10*3/uL (ref 150–400)
RBC: 3.98 MIL/uL (ref 3.87–5.11)
RDW: 12.5 % (ref 11.5–15.5)
WBC: 8.7 10*3/uL (ref 4.0–10.5)
nRBC: 0 % (ref 0.0–0.2)

## 2019-02-11 LAB — HCG, QUANTITATIVE, PREGNANCY: hCG, Beta Chain, Quant, S: 295 m[IU]/mL — ABNORMAL HIGH (ref <5)

## 2019-02-11 MED ORDER — PROMETHAZINE HCL 25 MG PO TABS: 25.0000 mg | ORAL_TABLET | Freq: Four times a day (QID) | ORAL | 0 refills | Status: DC | PRN

## 2019-02-11 MED ORDER — PROMETHAZINE HCL 25 MG PO TABS
25.0000 mg | ORAL_TABLET | Freq: Once | ORAL | Status: AC
  Administered 2019-02-11: 06:00:00 25 mg via ORAL
  Filled 2019-02-11: qty 1

## 2019-02-11 NOTE — MAU Note (Signed)
EMS arrival. Pt had MTX today for ectopic pregnancy. Stated se woke up and had severe pain on her lower left side . Got up to BR and felt dizzy and light headed. Pain was about 8-9. Took tylenol and pain is now a 6. Reports some vaginal spotting.

## 2019-02-11 NOTE — Discharge Instructions (Signed)
Methotrexate Treatment for an Ectopic Pregnancy, Care After This sheet gives you information about how to care for yourself after your procedure. Your health care provider may also give you more specific instructions. If you have problems or questions, contact your health care provider. What can I expect after the procedure? After the procedure, it is common to have:  Abdominal cramping.  Vaginal bleeding.  Fatigue.  Nausea.  Vomiting.  Diarrhea. Blood tests will be taken at timed intervals for several days or weeks to check your pregnancy hormone levels. The blood tests will be done until the pregnancy hormone can no longer be detected in the blood. Follow these instructions at home: Activity  Do not have sex until your health care provider approves.  Limit activities that take a lot of effort as told by your health care provider. Medicines  Take over the counter and prescription medicines only as told by your health care provider.  Do not take aspirin, ibuprofen, naproxen, or any other NSAIDs.  Do not take folic acid, prenatal vitamins, or other vitamins that contain folic acid. General instructions   Do not drink alcohol.  Follow instructions from your health care provider on how and when to report any symptoms that may indicate a ruptured ectopic pregnancy.  Keep all follow-up visits as told by your health care provider. This is important. Contact a health care provider if:  You have persistent nausea and vomiting.  You have persistent diarrhea.  You are having a reaction to the medicine, such as: ? Tiredness. ? Skin rash. ? Hair loss. Get help right away if:  Your abdominal or pelvic pain gets worse.  You have more vaginal bleeding.  You feel light-headed or you faint.  You have shortness of breath.  Your heart rate increases.  You develop a cough.  You have chills.  You have a fever. Summary  After the procedure, it is common to have symptoms  of abdominal cramping, vaginal bleeding and fatigue. You may also experience other symptoms.  Blood tests will be taken at timed intervals for several days or weeks to check your pregnancy hormone levels. The blood tests will be done until the pregnancy hormone can no longer be detected in the blood.  Limit strenuous activity as told by your health care provider.  Follow instructions from your health care provider on how and when to report any symptoms that may indicate a ruptured ectopic pregnancy. This information is not intended to replace advice given to you by your health care provider. Make sure you discuss any questions you have with your health care provider. Document Revised: 12/15/2016 Document Reviewed: 02/22/2016 Elsevier Patient Education  2020 Elsevier Inc.  

## 2019-02-11 NOTE — MAU Provider Note (Signed)
History     CSN: [id number]  Arrival date and time: 02/11/19 0434   First Provider Initiated Contact with Patient 02/11/19 0602      Chief Complaint  Patient presents with  . Abdominal Pain   HPI Danielle [name] is a 34 y.o. [id number] at [redacted]w[redacted]d who presents to MAU via EMS with chief complaint of severe abdominal pain. She is s/p Methotrexate administration on 02/10/2019 for inappropriate rise in Quant hCG. Her pain is LLQ and suprapubic, rated as 8-9/10. She took 4-5 Tylenol around 0330 today and is experiencing a reduction in her pain score. She states she also experienced a small amount of relief when she passed flatus and had a bowel movement.   Patient also endorses dark brown vaginal spotting, new onset. She denies frank red vaginal bleeding, dysuria, abdominal tenderness, fever or recent illness.  Patient endorses severe nausea s/p Methotrexate. She requests rx for antiemetic.  OB History    Gravida  4   Para  2   Term  1   Preterm  1   AB  1   Living  2     SAB  1   TAB      Ectopic      Multiple      Live Births  2        Obstetric Comments  2nd pregnancy delivered at 42 weeks Prior deliveries in [place]         Past Medical History:  Diagnosis Date  . Degenerative arthritis of knee    left   . Hx of alpha thalassemia     Past Surgical History:  Procedure Laterality Date  . CESAREAN SECTION    . LAPAROSCOPIC GASTRIC SLEEVE RESECTION      Family History  Problem Relation Age of Onset  . Leukemia Mother   . Renal Disease Mother   . Diabetes Father   . Hypertension Father   . Prostate cancer Father   . Dementia Father     Social History   Tobacco Use  . Smoking status: Never Smoker  . Smokeless tobacco: Never Used  Substance Use Topics  . Alcohol use: Never  . Drug use: Never    Allergies: No Known Allergies  No medications prior to admission.    Review of Systems  Constitutional: Negative for chills, fatigue and  fever.  Respiratory: Negative for shortness of breath.   Gastrointestinal: Positive for abdominal pain.  Genitourinary: Positive for vaginal bleeding. Negative for dysuria.  Musculoskeletal: Negative for back pain.  Neurological: Negative for dizziness, syncope and weakness.  All other systems reviewed and are negative.  Physical Exam   Blood pressure 129/64, pulse 77, temperature 98.5 F (36.9 C), resp. rate 18, last menstrual period 01/28/2019.  Physical Exam  Nursing note and vitals reviewed. Constitutional: She is oriented to person, place, and time. She appears well-developed and well-nourished.  Cardiovascular: Normal rate and regular rhythm.  Respiratory: Effort normal and breath sounds normal. No respiratory distress.  GI: Soft. Bowel sounds are normal. She exhibits no distension. There is no abdominal tenderness. There is no rebound and no guarding.  Neurological: She is alert and oriented to person, place, and time.  Skin: Skin is warm and dry.  Psychiatric: She has a normal mood and affect. Her behavior is normal. Judgment and thought content normal.    MAU Course/MDM  Procedures   --Quant 513 on 01/25, 295 in MAU this evening --Patient's pain score 3/10 following ultrasound  Patient Vitals  for the past 24 hrs:  BP Temp Pulse Resp  02/11/19 0624 129/64 -- 77 --  02/11/19 0443 (!) 107/58 98.5 F (36.9 C) 71 18   Results for orders placed or performed during the hospital encounter of 02/11/19 (from the past 24 hour(s))  CBC     Status: Abnormal   Collection Time: 02/11/19  5:30 AM  Result Value Ref Range   WBC 8.7 4.0 - 10.5 K/uL   RBC 3.98 3.87 - 5.11 MIL/uL   Hemoglobin 9.8 (L) 12.0 - 15.0 g/dL   HCT 68.0 (L) 32.1 - 22.4 %   MCV 79.9 (L) 80.0 - 100.0 fL   MCH 24.6 (L) 26.0 - 34.0 pg   MCHC 30.8 30.0 - 36.0 g/dL   RDW 82.5 00.3 - 70.4 %   Platelets 298 150 - 400 K/uL   nRBC 0.0 0.0 - 0.2 %  hCG, quantitative, pregnancy     Status: Abnormal   Collection  Time: 02/11/19  5:30 AM  Result Value Ref Range   hCG, Beta Chain, Quant, S 295 (H) <5 mIU/mL   US OB Transvaginal  Result Date: 02/11/2019 CLINICAL DATA:  Pregnancy. Follow-up from prior study of 02/06/2019. Recent methotrexate treatment. Recent IUD removal. Abdominal pain. EXAM: OBSTETRIC <14 WK ULTRASOUND TECHNIQUE: Transvaginal ultrasound was performed for evaluation of the gestation as well as the maternal uterus and adnexal regions. COMPARISON:  02/06/2019. FINDINGS: Intrauterine gestational sac: None visualized Yolk sac:  None visualized Embryo:  None visualized Cardiac Activity: None visualized Subchorionic hemorrhage:  None visualized. Maternal uterus/adnexae: Unremarkable.  No free fluid. IMPRESSION: 1. No intrauterine gestational sac/pregnancy noted on today's exam. No acute abnormality identified. 2.  Interval removal of IUD. Electronically Signed   By: Maisie Fus  Register   On: 02/11/2019 05:56   Meds ordered this encounter  Medications  . promethazine (PHENERGAN) 25 MG tablet    Sig: Take 1 tablet (25 mg total) by mouth every 6 (six) hours as needed for nausea or vomiting.    Dispense:  30 tablet    Refill:  0    Order Specific Question:   Supervising Provider    Answer:   Despina Hidden, LUTHER H [[id number]]  . promethazine (PHENERGAN) tablet 25 mg   Assessment and Plan  --34 y.o. [name] at [redacted]w[redacted]d  --S/p Methotrexate, symptoms as expected and improving with time --S/p Rhogam 02/06/2019 --Discharge home in stable condition  Danielle [name], CNM 02/11/2019, 7:24 AM

## 2019-02-12 ENCOUNTER — Ambulatory Visit: Payer: Medicaid Other

## 2019-02-13 ENCOUNTER — Ambulatory Visit (INDEPENDENT_AMBULATORY_CARE_PROVIDER_SITE_OTHER): Payer: Medicaid Other

## 2019-02-13 ENCOUNTER — Other Ambulatory Visit: Payer: Self-pay

## 2019-02-13 DIAGNOSIS — O008 Other ectopic pregnancy without intrauterine pregnancy: Secondary | ICD-10-CM | POA: Diagnosis not present

## 2019-02-13 LAB — BETA HCG QUANT (REF LAB): hCG Quant: 315 m[IU]/mL

## 2019-02-13 NOTE — Progress Notes (Signed)
Pt here today for STAT Beta Lab s/p day 4 MTX tx.  Pt reports vaginal spotting and mild pain that she rates a 4 on 0-10 pain scale.  Pt advised that it will take approximately 2 hrs for results.  Pt verbalized understanding.    Received call from [facility] pt's beta results are 315.  Notified Dr. Marice Potter pt's results.  Provider recommended that pt f/u on day 7 for STAT beta lab.  Notified pt provider's recommendation.  Pt had appt already scheduled with MAU on Sunday.  I advised pt that if starts bleeding like a period or her pain intensifies to please go to MAU.  Pt verbalized understanding.    Addison Naegeli, RN 02/13/19

## 2019-02-14 NOTE — Progress Notes (Signed)
I have reviewed this chart and agree with the RN/CMA assessment and management.    Dariush Mcnellis C Azeneth Carbonell, MD, FACOG Attending Physician, [facility]  

## 2019-02-16 ENCOUNTER — Other Ambulatory Visit: Payer: Self-pay

## 2019-02-16 ENCOUNTER — Inpatient Hospital Stay (HOSPITAL_COMMUNITY)
Admission: AD | Admit: 2019-02-16 | Discharge: 2019-02-16 | Disposition: A | Discharge status: Home / Self Care | Payer: Medicaid Other | Attending: Obstetrics & Gynecology | Admitting: Obstetrics & Gynecology

## 2019-02-16 DIAGNOSIS — O3680X0 Pregnancy with inconclusive fetal viability, not applicable or unspecified: Secondary | ICD-10-CM | POA: Diagnosis present

## 2019-02-16 DIAGNOSIS — Z3A00 Weeks of gestation of pregnancy not specified: Secondary | ICD-10-CM | POA: Insufficient documentation

## 2019-02-16 DIAGNOSIS — O2630 Retained intrauterine contraceptive device in pregnancy, unspecified trimester: Secondary | ICD-10-CM | POA: Diagnosis not present

## 2019-02-16 DIAGNOSIS — Z5181 Encounter for therapeutic drug level monitoring: Secondary | ICD-10-CM

## 2019-02-16 LAB — COMPREHENSIVE METABOLIC PANEL
ALT: 13 U/L (ref 0–44)
AST: 13 U/L — ABNORMAL LOW (ref 15–41)
Albumin: 2.9 g/dL — ABNORMAL LOW (ref 3.5–5.0)
Alkaline Phosphatase: 44 U/L (ref 38–126)
Anion gap: 7 (ref 5–15)
BUN: 10 mg/dL (ref 6–20)
CO2: 25 mmol/L (ref 22–32)
Calcium: 8.5 mg/dL — ABNORMAL LOW (ref 8.9–10.3)
Chloride: 105 mmol/L (ref 98–111)
Creatinine, Ser: 0.57 mg/dL (ref 0.44–1.00)
GFR calc Af Amer: >60 mL/min (ref >60)
GFR calc non Af Amer: >60 mL/min (ref >60)
Glucose, Bld: 92 mg/dL (ref 70–99)
Potassium: 3.8 mmol/L (ref 3.5–5.1)
Sodium: 137 mmol/L (ref 135–145)
Total Bilirubin: 0.5 mg/dL (ref 0.3–1.2)
Total Protein: 6 g/dL — ABNORMAL LOW (ref 6.5–8.1)

## 2019-02-16 LAB — CBC
HCT: 29.3 % — ABNORMAL LOW (ref 36.0–46.0)
Hemoglobin: 8.8 g/dL — ABNORMAL LOW (ref 12.0–15.0)
MCH: 24.4 pg — ABNORMAL LOW (ref 26.0–34.0)
MCHC: 30 g/dL (ref 30.0–36.0)
MCV: 81.4 fL (ref 80.0–100.0)
Platelets: 278 10*3/uL (ref 150–400)
RBC: 3.6 MIL/uL — ABNORMAL LOW (ref 3.87–5.11)
RDW: 12.6 % (ref 11.5–15.5)
WBC: 5.9 10*3/uL (ref 4.0–10.5)
nRBC: 0 % (ref 0.0–0.2)

## 2019-02-16 LAB — HCG, QUANTITATIVE, PREGNANCY: hCG, Beta Chain, Quant, S: 484 m[IU]/mL — ABNORMAL HIGH (ref <5)

## 2019-02-16 MED ORDER — FERROUS SULFATE 325 (65 FE) MG PO TABS: 325.0000 mg | ORAL_TABLET | Freq: Every day | ORAL | 0 refills | Status: DC

## 2019-02-16 MED ORDER — METHOTREXATE FOR ECTOPIC PREGNANCY
50.0000 mg/m2 | Freq: Once | INTRAMUSCULAR | Status: AC
  Administered 2019-02-16: 130 mg via INTRAMUSCULAR
  Filled 2019-02-16: qty 2

## 2019-02-16 NOTE — MAU Note (Signed)
Contacted lab regarding new orders, Marchelle Folks in lab states she will take care of it.

## 2019-02-16 NOTE — MAU Note (Signed)
Danielle [name] is a 34 y.o. at [redacted]w[redacted]d here in MAU reporting: here for day 7 post MTX. Having some spotting. Has intermittent pain but is not having any right now.   Pain score: 0/10  Vitals:   02/16/19 0907  BP: 137/76  Pulse: 75  Resp: 18  Temp: 98.6 F (37 C)  SpO2: 100%     Lab orders placed from triage: hcg

## 2019-02-16 NOTE — Discharge Instructions (Signed)
-  RETURN TO MAU ON WEDNESAY 2/3 after work for blood draw, and again on Saturday February 6th for blood draw. DO NOT MISS THESE APPOINTMENTS!    Methotrexate Treatment for an Ectopic Pregnancy, Care After This sheet gives you information about how to care for yourself after your procedure. Your health care provider may also give you more specific instructions. If you have problems or questions, contact your health care provider. What can I expect after the procedure? After the procedure, it is common to have:  Abdominal cramping.  Vaginal bleeding.  Fatigue.  Nausea.  Vomiting.  Diarrhea. Blood tests will be taken at timed intervals for several days or weeks to check your pregnancy hormone levels. The blood tests will be done until the pregnancy hormone can no longer be detected in the blood. Follow these instructions at home: Activity  Do not have sex until your health care provider approves.  Limit activities that take a lot of effort as told by your health care provider. Medicines  Take over the counter and prescription medicines only as told by your health care provider.  Do not take aspirin, ibuprofen, naproxen, or any other NSAIDs.  Do not take folic acid, prenatal vitamins, or other vitamins that contain folic acid. General instructions   Do not drink alcohol.  Follow instructions from your health care provider on how and when to report any symptoms that may indicate a ruptured ectopic pregnancy.  Keep all follow-up visits as told by your health care provider. This is important. Contact a health care provider if:  You have persistent nausea and vomiting.  You have persistent diarrhea.  You are having a reaction to the medicine, such as: ? Tiredness. ? Skin rash. ? Hair loss. Get help right away if:  Your abdominal or pelvic pain gets worse.  You have more vaginal bleeding.  You feel light-headed or you faint.  You have shortness of breath.  Your  heart rate increases.  You develop a cough.  You have chills.  You have a fever. Summary  After the procedure, it is common to have symptoms of abdominal cramping, vaginal bleeding and fatigue. You may also experience other symptoms.  Blood tests will be taken at timed intervals for several days or weeks to check your pregnancy hormone levels. The blood tests will be done until the pregnancy hormone can no longer be detected in the blood.  Limit strenuous activity as told by your health care provider.  Follow instructions from your health care provider on how and when to report any symptoms that may indicate a ruptured ectopic pregnancy. This information is not intended to replace advice given to you by your health care provider. Make sure you discuss any questions you have with your health care provider. Document Revised: 12/15/2016 Document Reviewed: 02/22/2016 Elsevier Patient Education  2020 [employer].

## 2019-02-16 NOTE — MAU Provider Note (Signed)
Patient Danielle [name] is a 34 y.o. [id number] who received first dose of methotrexate injection on 1/23, now here for Day 7 labs. [place] on 1/26 showed that gestational sac was no longer present after being seen on [place] on 1/21.   She reports still continued cramping and dark brown bleeding. Quant today is 484.  Discussed quant and past [place] results with Dr. Macon Large, who recommends repeat dose of methotrexate.     Ref Range & Units 09:15 5 d ago 8 d ago 10 d ago  hCG, Beta Chain, Quant, S <5 mIU/mL 484High   295High  CM  389High  CM  247High  CM    CBC and CMP are stable; although Hgb has dropped one point.   Patient is asymptomatic, no dizziness, shortness of breath.  Tolerated injection well, and will come to MAU for follow up bHCG on Wednesday night, 2/3, and Saturday, 2/6 for Day #4 and Day #7 labs.   Discharge home with iron PO and increase in diet.  Counseled patient on pain management, diet with methotrexate (avoid folic acid), and recommendation to wait 3 months before trying to conceive.  Patient also knows that she will need weekly blood draws to make sure beta HCG drops.   Charlesetta Garibaldi Ashelyn Mccravy 02/16/2019, 11:03 AM

## 2019-02-18 ENCOUNTER — Ambulatory Visit (INDEPENDENT_AMBULATORY_CARE_PROVIDER_SITE_OTHER): Payer: Medicaid Other | Admitting: Family Medicine

## 2019-02-18 ENCOUNTER — Other Ambulatory Visit: Payer: Self-pay

## 2019-02-18 ENCOUNTER — Encounter: Payer: Self-pay | Admitting: Family Medicine

## 2019-02-18 VITALS — BP 100/60 | HR 75 | Ht 64.0 in | Wt 330.5 lb

## 2019-02-18 DIAGNOSIS — Z8759 Personal history of other complications of pregnancy, childbirth and the puerperium: Secondary | ICD-10-CM | POA: Diagnosis not present

## 2019-02-18 DIAGNOSIS — O00109 Unspecified tubal pregnancy without intrauterine pregnancy: Secondary | ICD-10-CM

## 2019-02-18 DIAGNOSIS — Z23 Encounter for immunization: Secondary | ICD-10-CM | POA: Diagnosis not present

## 2019-02-18 NOTE — Patient Instructions (Addendum)
  Hi Danielle [name],  It was lovely to meet you today!! I am sorry that you have had a ectopic pregnancy. I am glad that you are following up at the [facility] so closely and are getting treatment for this. I feel you would benefit from some talking therapy. Please let me know if you would like to speak to one of our Psychologists.  I will speak to you on the 25th Feb on the telephone/facetime to see how you are getting on. Please do not hesitate to contact me if you have further questions.   Best wishes,  Dr Allena Katz      Ectopic Pregnancy  An ectopic pregnancy happens when a fertilized egg grows outside the womb (uterus). The fertilized egg cannot stay alive outside of the womb. This problem often happens in a fallopian tube. It is often caused by damage to the tube. If this problem is found early, you may be treated with medicine that stops the egg from growing. If your tube tears or bursts open (ruptures), you will bleed inside. Often, there is very bad pain in the lower belly. This is an emergency. You will need surgery. Get help right away. Follow these instructions at home: After being treated with medicine or surgery:  Rest and limit your activity for as long as told by your doctor.  Until your doctor says that it is safe: ? Do not lift anything that is heavier than 10 lb (4.5 kg) or the limit that your doctor tells you. ? Avoid exercise and any movement that takes a lot of effort.  To prevent problems when pooping (constipation): ? Eat a healthy diet. This includes:  Fruits.  Vegetables.  Whole grains. ? Drink 6-8 glasses of water a day. Contact a doctor if: Get help right away if:  You have sudden and very bad pain in your belly.  You have very bad pain in your shoulders or neck.  You have pain that gets worse and is not helped by medicine.  You have: ? A fever or chills. ? Vaginal bleeding. ? Redness or swelling at the site of a surgical cut (incision).  You  feel sick to your stomach (nauseous) or you throw up (vomit).  You feel dizzy or weak.  You feel light-headed or you pass out (faint). Summary  An ectopic pregnancy happens when a fertilized egg grows outside the womb (uterus).  If this problem is found early, you may be treated with medicine that stops the egg from growing.  If your tube tears or bursts open (ruptures), you will need surgery. This is an emergency. Get help right away. This information is not intended to replace advice given to you by your health care provider. Make sure you discuss any questions you have with your health care provider. Document Revised: 12/15/2016 Document Reviewed: 01/27/2016 Elsevier Patient Education  2020 [employer].

## 2019-02-18 NOTE — Progress Notes (Addendum)
   CHIEF COMPLAINT / HPI: New patient visit, recently moved to to [place] from [place].   Current concerns include:  Ectopic pregnancy Had vaginal spotting since Dec 31st. Found out she was pregnant unexpectedly on 20th Jan with home pregnancy test (has IUD in situ). Had IUD removed on 21st January. Currently being treated at [facility] for ectopic pregnancy-having bHCG levels taken regularly. Has had 2nd round of Methotrexate 4 days ago. Has had cramp-taking tylenol and also nausea-taking Phenergan. News of ectopic pregnancy has been stressful for patient and her family. Denies low mood, suicidal ideation, thoughts of self harm or harm to others.  Family planning Pt and husband were planning to start conceiving for 3rd child in Feb which will now have to be delayed due to ectopic pregnancy. Pt is hopeful that they can start conceiving later this year. She will register with [facility].  HCM  Pap smear: March 2020 was normal   PMH: Alpha thalassemia, OA of knee   PSH: 2 X C-section, Gastric sleeve in 2015  DH: NKDA, none regular  FH: Grandmother DM, HTN, Mother: Leukemia 8, ESRD,   SH: Happily married, lives with husband and 2 daughters. Works at a [employer]. Vapes. Ex smoker: stopped smoking >10 years ago. Denies illicit drug use.   OBJECTIVE: BP 100/60   Pulse 75   Ht 5\' 4"  (1.626 m)   Wt (!) 330 lb 8 oz (149.9 kg)   LMP 01/28/2019   SpO2 99%   BMI 56.73 kg/m     General: Alert, pleasant, BMI 56.7 Cardio: Normal S1 and S2, RRR, no murmurs or rubs.   Pulm: CTAB, no crackles or wheeze Abdomen: Bowel sounds normal. Abdomen soft and non-tender.  Extremities: No peripheral edema.   ASSESSMENT / PLAN:  Ectopic pregnancy Ectopic pregnancy likely 2/2 IUD, currently treated with Methotrexate. Closely followed by OB at [facility]. Offered pt therapy resources/Dr [facility] as she endorsed it has been stressful on her and the family. She said she will think  about the therapy. Offered virtual visit for f/u in a 2-3 weeks time which she preferred.    Pt received flu and tetanus vaccine today  Shawnee Knapp, MD [facility]

## 2019-02-19 ENCOUNTER — Inpatient Hospital Stay (HOSPITAL_COMMUNITY)
Admission: AD | Admit: 2019-02-19 | Discharge: 2019-02-19 | Disposition: A | Discharge status: Home / Self Care | Payer: Medicaid Other | Attending: Family Medicine | Admitting: Family Medicine

## 2019-02-19 DIAGNOSIS — O00109 Unspecified tubal pregnancy without intrauterine pregnancy: Secondary | ICD-10-CM | POA: Insufficient documentation

## 2019-02-19 DIAGNOSIS — O2630 Retained intrauterine contraceptive device in pregnancy, unspecified trimester: Secondary | ICD-10-CM

## 2019-02-19 DIAGNOSIS — O009 Unspecified ectopic pregnancy without intrauterine pregnancy: Secondary | ICD-10-CM | POA: Diagnosis not present

## 2019-02-19 LAB — HCG, QUANTITATIVE, PREGNANCY: hCG, Beta Chain, Quant, S: 417 m[IU]/mL — ABNORMAL HIGH (ref <5)

## 2019-02-19 NOTE — MAU Note (Addendum)
Wynelle Bourgeois CNM in to see pt in Triage and discuss plan of care. OK for pt to leave and provider will call pt with BHCG results. Pt agrees with POC. Pt's phone number confirmed

## 2019-02-19 NOTE — Assessment & Plan Note (Signed)
Ectopic pregnancy likely 2/2 IUD, currently treated with Methotrexate. Closely followed by OB at [facility]. Offered pt therapy resources/Dr Shawnee Knapp as she endorsed it has been stressful on her and the family. She said she will think about the therapy. Offered virtual visit for f/u in a 2-3 weeks time which she preferred.

## 2019-02-19 NOTE — MAU Note (Signed)
Here for day #4 after MTX. Occ small brown d/c and states saw ? Small amt tissue at one point. Occ lower abd pain that feels a lot like gas. 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

## 2019-02-20 NOTE — MAU Provider Note (Signed)
Ms. Danielle [name]  is a 34 y.o. [id number] at [redacted]w[redacted]d who presents to the [facility] today for follow-up quant hCG after second dose of Methotrexate  The patient was seen in MAU on 02/06/19 Had gotten pregnant with a Paragard IUD in place.   HCG levels: 02/06/19  247  >>   Rhophylac 02/08/19  389 02/10/19  513   >> 1st Dose of MTX 02/11/19  295 02/13/19  315 02/16/19  484  >>  2nd Dose of MTX  She denies pain Denies vaginal bleeding  Denies fever    OB History  Gravida Para Term Preterm AB Living  4 2 1 1 1 2   SAB TAB Ectopic Multiple Live Births  1       2    # Outcome Date GA Lbr Len/2nd Weight Sex Delivery Anes PTL Lv  4 Current           3 Term      CS-Unspec   LIV  2 Preterm      CS-Unspec   LIV  1 SAB             Obstetric Comments  2nd pregnancy delivered at 36 weeks  Prior deliveries in     Past Medical History:  Diagnosis Date  . Degenerative arthritis of knee    left   . Hx of alpha thalassemia      BP (!) 144/85   Pulse 86   Temp 98.4 F (36.9 C)   Resp 18   Ht 5\' 4"  (1.626 m)   Wt (!) 150.6 kg   LMP 01/28/2019   BMI 56.99 kg/m   CONSTITUTIONAL: Well-developed, well-nourished female in no acute distress.  MUSCULOSKELETAL: Normal range of motion.  CARDIOVASCULAR: Regular heart rate RESPIRATORY: Normal effort NEUROLOGICAL: Alert and oriented to person, place, and time.  SKIN: Skin is warm and dry. No rash noted. Not diaphoretic. No erythema. No pallor. PSYCH: Normal mood and affect. Normal behavior. Normal judgment and thought content.  Results for orders placed or performed during the hospital encounter of 02/19/19 (from the past 24 hour(s))  hCG, quantitative, pregnancy     Status: Abnormal   Collection Time: 02/19/19  8:31 PM  Result Value Ref Range   hCG, Beta Chain, Quant, S 417 (H) <5 mIU/mL    A: Appropriate decrease in quant hCG after 2nd dose of Methotrexate  P: Discharge home Discussed precautions Will  return to MAU Saturday for Day #7 HCG level Patient may return to MAU as needed or if her condition were to change or worsen   04/19/19, CNM 02/20/2019 1:24 AM   Knows to abstain from alcohol, NSAIDs and sexual intercourse for two weeks. She was counseled to discontinue any MVI with folic acid. ?She understands to follow up on D7 for repeat BHCG and was given the instruction sheet. ?Strict ectopic precautions were reviewed, the patient knows to call with any abdominal pain, vomiting, fainting, or any concerns with her health.   Aviva Signs, CNM

## 2019-02-22 ENCOUNTER — Inpatient Hospital Stay (HOSPITAL_COMMUNITY)
Admission: AD | Admit: 2019-02-22 | Discharge: 2019-02-22 | Disposition: A | Discharge status: Home / Self Care | Payer: Medicaid Other | Attending: Obstetrics and Gynecology | Admitting: Obstetrics and Gynecology

## 2019-02-22 ENCOUNTER — Other Ambulatory Visit: Payer: Self-pay

## 2019-02-22 DIAGNOSIS — O0900 Supervision of pregnancy with history of infertility, unspecified trimester: Secondary | ICD-10-CM | POA: Diagnosis not present

## 2019-02-22 DIAGNOSIS — Z3A01 Less than 8 weeks gestation of pregnancy: Secondary | ICD-10-CM | POA: Diagnosis not present

## 2019-02-22 DIAGNOSIS — O009 Unspecified ectopic pregnancy without intrauterine pregnancy: Secondary | ICD-10-CM | POA: Diagnosis present

## 2019-02-22 DIAGNOSIS — O2630 Retained intrauterine contraceptive device in pregnancy, unspecified trimester: Secondary | ICD-10-CM

## 2019-02-22 DIAGNOSIS — Z5181 Encounter for therapeutic drug level monitoring: Secondary | ICD-10-CM

## 2019-02-22 DIAGNOSIS — Z79899 Other long term (current) drug therapy: Secondary | ICD-10-CM

## 2019-02-22 LAB — HCG, QUANTITATIVE, PREGNANCY: hCG, Beta Chain, Quant, S: 112 m[IU]/mL — ABNORMAL HIGH (ref <5)

## 2019-02-22 NOTE — MAU Note (Signed)
Discharged by CNM out of triage

## 2019-02-22 NOTE — MAU Note (Signed)
Danielle [name] is a 34 y.o. at [redacted]w[redacted]d here in MAU reporting: here for day 7 labs post 2nd dose of MTX. Is still having some bleeding- similar to a period. Wearing a pad and is changing it once per day. Is having some mild cramping  Pain score: 1/10  Vitals:   02/22/19 1704  BP: 134/70  Pulse: 86  Resp: 17  Temp: 98.5 F (36.9 C)  SpO2: 100%     Lab orders placed from triage: hcg

## 2019-02-25 ENCOUNTER — Other Ambulatory Visit: Payer: Self-pay | Admitting: *Deleted

## 2019-02-25 DIAGNOSIS — O009 Unspecified ectopic pregnancy without intrauterine pregnancy: Secondary | ICD-10-CM

## 2019-02-27 ENCOUNTER — Encounter: Payer: Medicaid Other | Admitting: Obstetrics and Gynecology

## 2019-02-27 ENCOUNTER — Encounter (INDEPENDENT_AMBULATORY_CARE_PROVIDER_SITE_OTHER): Payer: Self-pay

## 2019-02-27 ENCOUNTER — Other Ambulatory Visit: Payer: Self-pay

## 2019-02-27 ENCOUNTER — Other Ambulatory Visit: Payer: Medicaid Other

## 2019-02-27 DIAGNOSIS — O009 Unspecified ectopic pregnancy without intrauterine pregnancy: Secondary | ICD-10-CM

## 2019-02-28 LAB — BETA HCG QUANT (REF LAB): hCG Quant: 34 m[IU]/mL

## 2019-03-06 ENCOUNTER — Other Ambulatory Visit: Payer: Medicaid Other

## 2019-03-10 ENCOUNTER — Other Ambulatory Visit: Payer: Medicaid Other

## 2019-03-10 ENCOUNTER — Other Ambulatory Visit: Payer: Self-pay | Admitting: *Deleted

## 2019-03-10 DIAGNOSIS — O009 Unspecified ectopic pregnancy without intrauterine pregnancy: Secondary | ICD-10-CM

## 2019-03-12 ENCOUNTER — Telehealth: Payer: Self-pay | Admitting: Obstetrics & Gynecology

## 2019-03-12 NOTE — Telephone Encounter (Signed)
The patient called in stating her appointment for 2/22 was cancelled due to weather. She stated she would like to discuss rescheduling of the lab. Informed the patient she has an appointment with the provider tomorrow morning. Once she discusses the concern with the provider we can schedule to have her come in as she stated she can only visit on Wednesday and Thursday. The appointment is scheduled for 8:35, advised the patient if the provider would like for her to come in for a blood draw we can add her to the schedule tomorrow afternoon or next week between Wednesday and Thursday.

## 2019-03-13 ENCOUNTER — Telehealth (INDEPENDENT_AMBULATORY_CARE_PROVIDER_SITE_OTHER): Payer: Medicaid Other | Admitting: Obstetrics and Gynecology

## 2019-03-13 ENCOUNTER — Other Ambulatory Visit: Payer: Self-pay

## 2019-03-13 ENCOUNTER — Telehealth (INDEPENDENT_AMBULATORY_CARE_PROVIDER_SITE_OTHER): Payer: Medicaid Other | Admitting: Family Medicine

## 2019-03-13 ENCOUNTER — Encounter: Payer: Self-pay | Admitting: Obstetrics and Gynecology

## 2019-03-13 VITALS — BP 132/84 | HR 87 | Wt 340.8 lb

## 2019-03-13 DIAGNOSIS — Z3009 Encounter for other general counseling and advice on contraception: Secondary | ICD-10-CM

## 2019-03-13 DIAGNOSIS — O00109 Unspecified tubal pregnancy without intrauterine pregnancy: Secondary | ICD-10-CM

## 2019-03-13 DIAGNOSIS — D649 Anemia, unspecified: Secondary | ICD-10-CM | POA: Diagnosis not present

## 2019-03-13 DIAGNOSIS — O009 Unspecified ectopic pregnancy without intrauterine pregnancy: Secondary | ICD-10-CM | POA: Diagnosis not present

## 2019-03-13 DIAGNOSIS — O3680X0 Pregnancy with inconclusive fetal viability, not applicable or unspecified: Secondary | ICD-10-CM

## 2019-03-13 NOTE — Progress Notes (Signed)
Danielle [name] is a 34 y.o. female here for f/u.  Paragard IUD in place with positive pregnancy test: treated with MTX HCG levels: 02/06/19  247  >>   Rhophylac 02/08/19  389 02/10/19  513   >> 1st Dose of MTX 02/11/19  295 02/13/19  315 02/16/19  484  >>  2nd Dose of MTX 2/3/ 21   417 02/22/19    112 02/27/19   34   Quant today. Unprotected sex recently.  Follow up In 1 week.  She has no pain or bleeding.   Venia Carbon I, NP 03/13/2019 11:05 AM

## 2019-03-13 NOTE — Progress Notes (Signed)
I connected with  Danielle [name] on 03/13/19 at  8:35 AM EST by telephone and verified that I am speaking with the correct person using two identifiers.   I discussed the limitations, risks, security and privacy concerns of performing an evaluation and management service by telephone and the availability of in person appointments. I also discussed with the patient that there may be a patient responsible charge related to this service. The patient expressed understanding and agreed to proceed.  Ernestina Patches, CMA 03/13/2019  8:37 AM   Wants to discuss Lab Visits.

## 2019-03-14 LAB — BETA HCG QUANT (REF LAB): hCG Quant: 1 m[IU]/mL

## 2019-03-16 DIAGNOSIS — D649 Anemia, unspecified: Secondary | ICD-10-CM | POA: Insufficient documentation

## 2019-03-16 DIAGNOSIS — Z3009 Encounter for other general counseling and advice on contraception: Secondary | ICD-10-CM | POA: Insufficient documentation

## 2019-03-16 NOTE — Assessment & Plan Note (Signed)
Likely 2/2 blood loss from miscarriage. Per OB continue Iron supplements.

## 2019-03-16 NOTE — Assessment & Plan Note (Addendum)
Likely completion of ectopic pregnancy, management and follow up per OB. Offered pt therapy resources/therapy with Dr Shawnee Knapp given this has been an acutely stressful time for the patient. She was open to this and would be happy to do therapy.

## 2019-03-16 NOTE — Assessment & Plan Note (Addendum)
Birth control counseling provided to patient. We discussed depo and IUD as most appropriate methods of birth control for the next month: both high in efficacy and low maintenance. Advantages of depo for pt that she would only need one injection for the 3 month and immediate return to fertility after. Explain side effects: irregular bleeding, weight gain and mood disturbance. Advantages of IUD for pt would be less systemic hormonal effects and immediate return to fertility as she and her husband hope to try conceiving for baby after 3 months. Pt aware that IUD are more of a long term contraceptive method but open to trying due to less side effects. Ultimately pt will choose birth control with OB but was useful for pt to have this discussion today.

## 2019-03-16 NOTE — Progress Notes (Addendum)
[facility] Telemedicine Visit  Patient consented to have virtual visit. Method of visit:  Video  Encounter participants: Patient: Danielle [name] - located at home Provider: Towanda Octave - located at [facility] Others (if applicable):   Chief Complaint: Ectopic pregnancy and birth control   HPI:  Ectopic pregnancy Followed by [facility] for recent ectopic pregnancy complicated by IUD. Last dose of Methotrexate was on 25th Feb. Had total 10-12 vaginal bleeding after, has now stopped bleeding. Waiting for serum bHCG results to ensure they are dropping.  Anemia  Has PMH of Anemia. Recently Hb dropped to 8.8 likely 2/2 bleeding due to miscarriage as above. Was started on iron supplements by OB. Since started her symptoms: feeling sluggish and tired have improved.  Birth control Would like to speak about birth control options today. Per OB must choose form of birth control for at least 3 months as has recently taken Methotrexate. Is against OCP because of the hormones it contains and has previously had bad side effects. Also does not want to mess up her periods as she has worked hard to regulate them. Asked me to explain the efficacy, pros and cons of other methods of birth control. Would like to start conceiving for 3rd child after 3 months of birth control.   Mood Denies low mood. Family particularly her husband is her largest support. Denies suicidal ideation, thoughts of self harm or harm to others. Offered short term counseling given recent signifcant life stressor. Pt open to this.    ROS: per HPI  Pertinent PMHx: Ectopic pregnancy, Obesity  Exam:  Well appearing and cheerful on facetime Respiratory: Speaking in full sentences, normal WOB  Assessment/Plan:  Anemia Likely 2/2 blood loss from miscarriage. Per OB continue Iron supplements.   Birth control counseling Birth control counseling provided to patient. We discussed depo and IUD as most appropriate methods of  birth control for the next month: both high in efficacy and low maintenance. Advantages of depo for pt that she would only need one injection for the 3 month and immediate return to fertility after. Explain side effects: irregular bleeding, weight gain and mood disturbance. Advantages of IUD for pt would be less systemic hormonal effects and immediate return to fertility as she and her husband hope to try conceiving for baby after 3 months. Pt aware that IUD are more of a long term contraceptive method but open to trying due to less side effects. Ultimately pt will choose birth control with OB but was useful for pt to have this discussion today.   Ectopic pregnancy Likely completion of ectopic pregnancy, management and follow up per OB. Offered pt therapy resources/therapy with Dr Shawnee Knapp given this has been an acutely stressful time for the patient. She was open to this and would be happy to do therapy.   Time spent during visit with patient: 25 minutes

## 2019-03-19 ENCOUNTER — Telehealth: Payer: Self-pay | Admitting: Psychology

## 2019-03-19 NOTE — Telephone Encounter (Signed)
Scheduled virtual appt with pt for integrated care f/u for 3/11 at 10AM

## 2019-03-19 NOTE — Telephone Encounter (Signed)
Thank you Dr Shawnee Knapp, much appreciated.

## 2019-03-23 ENCOUNTER — Other Ambulatory Visit: Payer: Self-pay | Admitting: Student

## 2019-03-24 MED ORDER — FERROUS SULFATE 325 (65 FE) MG PO TABS: 325.0000 mg | ORAL_TABLET | Freq: Every day | ORAL | 0 refills | Status: DC

## 2019-03-27 ENCOUNTER — Ambulatory Visit (INDEPENDENT_AMBULATORY_CARE_PROVIDER_SITE_OTHER): Payer: Medicaid Other | Admitting: Psychology

## 2019-03-27 ENCOUNTER — Other Ambulatory Visit: Payer: Self-pay

## 2019-03-27 DIAGNOSIS — F430 Acute stress reaction: Secondary | ICD-10-CM

## 2019-03-27 NOTE — BH Specialist Note (Signed)
Integrated Behavioral Health Visit via Telemedicine (Telephone)  03/27/2019 Swayzie Choate [id number]   Session Start time: 10  Session End time: 1030 Total time: 30  Referring Provider: Dr. Allena Katz Type of Visit: Telephonic Patient location: home [facility] Provider location: [facility] All persons participating in visit: Dr. Shawnee Knapp and pt      The following statements were read to the patient and/or legal guardian that are established with the [facility] Provider.  "The purpose of this phone visit is to provide behavioral health care while limiting exposure to the coronavirus (COVID19).  There is a possibility of technology failure and discussed alternative modes of communication if that failure occurs."  "By engaging in this telephone visit, you consent to the provision of healthcare.  Additionally, you authorize for your insurance to be billed for the services provided during this telephone visit."   Patient and/or legal guardian consented to telephone visit: Yes   PRESENTING CONCERNS: Patient and/or family reports the following symptoms/concerns: Pt reports a recent miscarriage has caused a lot of stress in her life.  She reports the experience was horrible.  Pt reported she has past traumas that this miscarriage triggered.  Pt discussed her previous traumas and how she engages in emotion regulation.  Pt denied SI.    Duration of problem: past 3 months; Severity of problem: mild  STRENGTHS (Protective Factors/Coping Skills): Supportive family and insight   GOALS ADDRESSED: Patient will: 1.  Reduce symptoms of: stress: pt experiencing breakdowns emotionally 2.  Increase knowledge and/or ability of: self-management skills: pt is insightful about self-management skills and therapy will continue to enhance  3.  Demonstrate ability to: Increase healthy adjustment to current life circumstances  INTERVENTIONS: Interventions utilized:  Supportive Counseling Standardized Assessments  completed: Not Needed  ASSESSMENT: Patient currently experiencing acute stress related to her recent miscarriage.   Patient may benefit from supportive counseling.   PLAN: 1. Follow up with behavioral health clinician on : navigating stress and what it looks like to pt  2. Behavioral recommendations: continued therapy  3. Referral(s): Integrated [employer] [facility] (In Clinic)  Royetta Asal, PhD., LMFT-A

## 2019-04-03 ENCOUNTER — Other Ambulatory Visit (HOSPITAL_COMMUNITY)
Admission: RE | Admit: 2019-04-03 | Discharge: 2019-04-03 | Disposition: A | Discharge status: Home / Self Care | Payer: Medicaid Other | Source: Ambulatory Visit | Attending: Obstetrics and Gynecology | Admitting: Obstetrics and Gynecology

## 2019-04-03 ENCOUNTER — Ambulatory Visit (INDEPENDENT_AMBULATORY_CARE_PROVIDER_SITE_OTHER): Payer: Medicaid Other | Admitting: Obstetrics and Gynecology

## 2019-04-03 ENCOUNTER — Encounter: Payer: Self-pay | Admitting: Obstetrics and Gynecology

## 2019-04-03 ENCOUNTER — Ambulatory Visit (INDEPENDENT_AMBULATORY_CARE_PROVIDER_SITE_OTHER): Payer: Medicaid Other | Admitting: Psychology

## 2019-04-03 ENCOUNTER — Other Ambulatory Visit: Payer: Self-pay

## 2019-04-03 VITALS — BP 143/85 | HR 80 | Wt 336.1 lb

## 2019-04-03 DIAGNOSIS — Z01419 Encounter for gynecological examination (general) (routine) without abnormal findings: Secondary | ICD-10-CM

## 2019-04-03 DIAGNOSIS — E282 Polycystic ovarian syndrome: Secondary | ICD-10-CM | POA: Diagnosis not present

## 2019-04-03 DIAGNOSIS — Z3042 Encounter for surveillance of injectable contraceptive: Secondary | ICD-10-CM

## 2019-04-03 DIAGNOSIS — F430 Acute stress reaction: Secondary | ICD-10-CM

## 2019-04-03 DIAGNOSIS — Z87898 Personal history of other specified conditions: Secondary | ICD-10-CM

## 2019-04-03 DIAGNOSIS — Z124 Encounter for screening for malignant neoplasm of cervix: Secondary | ICD-10-CM | POA: Diagnosis not present

## 2019-04-03 DIAGNOSIS — Z872 Personal history of diseases of the skin and subcutaneous tissue: Secondary | ICD-10-CM

## 2019-04-03 MED ORDER — MEDROXYPROGESTERONE ACETATE 150 MG/ML IM SUSP
150.0000 mg | Freq: Once | INTRAMUSCULAR | Status: AC
  Administered 2019-04-03: 150 mg via INTRAMUSCULAR
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
## 2019-04-04 LAB — CBC
Hematocrit: 34.3 % (ref 34.0–46.6)
Hemoglobin: 10.3 g/dL — ABNORMAL LOW (ref 11.1–15.9)
MCH: 24.5 pg — ABNORMAL LOW (ref 26.6–33.0)
MCHC: 30 g/dL — ABNORMAL LOW (ref 31.5–35.7)
MCV: 82 fL (ref 79–97)
Platelets: 313 10*3/uL (ref 150–450)
RBC: 4.2 x10E6/uL (ref 3.77–5.28)
RDW: 12.3 % (ref 11.7–15.4)
WBC: 8 10*3/uL (ref 3.4–10.8)

## 2019-04-04 LAB — HEMOGLOBIN A1C
Est. average glucose Bld gHb Est-mCnc: 82 mg/dL
Hgb A1c MFr Bld: 4.5 % — ABNORMAL LOW (ref 4.8–5.6)

## 2019-04-04 LAB — TESTOSTERONE: Testosterone: 26 ng/dL (ref 8–48)

## 2019-04-04 LAB — TSH: TSH: 2.17 u[IU]/mL (ref 0.450–4.500)

## 2019-04-07 LAB — CYTOLOGY - PAP
Comment: NEGATIVE
Diagnosis: NEGATIVE
High risk HPV: NEGATIVE

## 2019-04-09 ENCOUNTER — Other Ambulatory Visit: Payer: Self-pay | Admitting: Obstetrics and Gynecology

## 2019-04-09 DIAGNOSIS — L680 Hirsutism: Secondary | ICD-10-CM | POA: Insufficient documentation

## 2019-04-09 MED ORDER — SPIRONOLACTONE 50 MG PO TABS: 50.0000 mg | ORAL_TABLET | Freq: Two times a day (BID) | ORAL | 2 refills | Status: DC

## 2019-04-17 ENCOUNTER — Telehealth: Payer: Self-pay | Admitting: Psychology

## 2019-04-17 ENCOUNTER — Other Ambulatory Visit: Payer: Self-pay

## 2019-04-17 ENCOUNTER — Ambulatory Visit: Payer: Medicaid Other | Admitting: Psychology

## 2019-04-17 NOTE — Telephone Encounter (Signed)
Checked with pt.  Pt reported she is doing much better and would like to discontinue therapy at this time.

## 2019-04-17 NOTE — Telephone Encounter (Signed)
Great, thank you for your input Dr Shawnee Knapp!

## 2019-05-12 ENCOUNTER — Encounter: Payer: Self-pay | Admitting: *Deleted

## 2019-05-14 ENCOUNTER — Ambulatory Visit (INDEPENDENT_AMBULATORY_CARE_PROVIDER_SITE_OTHER): Payer: Medicaid Other | Admitting: *Deleted

## 2019-05-14 ENCOUNTER — Other Ambulatory Visit: Payer: Self-pay

## 2019-05-14 DIAGNOSIS — Z3202 Encounter for pregnancy test, result negative: Secondary | ICD-10-CM | POA: Diagnosis not present

## 2019-05-14 DIAGNOSIS — Z3200 Encounter for pregnancy test, result unknown: Secondary | ICD-10-CM

## 2019-05-14 LAB — POCT PREGNANCY, URINE: Preg Test, Ur: NEGATIVE

## 2019-05-14 NOTE — Progress Notes (Signed)
Pt received initial injection of Depo Provera on 04/03/19. She is not having periods and was not sure if this is normal.  We discussed the normal types of changes in menstrual cycle which can occur while on Depo Provera. She has nausea and breast tenderness. Even though she did not think she could be pregnant, pt desired confirmation. Pt also states that she stopped taking spironolactone after 1.5 weeks because it caused her to have chest tightening and a fast heart rate as well as sharp pains in chest. These symptoms went away after stopping the spironolactone. Pt states she will not be continuing with Depo Provera injections because she desires a pregnancy. She also is aware that she should not take the Spironolactone during pregnancy. Pt was advised to contact our office if she has further questions or Ob/Gyn care needs. She voiced understanding.

## 2019-05-14 NOTE — Progress Notes (Signed)
Patient seen and assessed by nursing staff during this encounter. I have reviewed the chart and agree with the documentation and plan. I have also made any necessary editorial changes.  Thressa Sheller DNP, CNM  05/14/19  1:19 PM

## 2019-11-11 ENCOUNTER — Other Ambulatory Visit: Payer: Self-pay

## 2019-11-11 ENCOUNTER — Ambulatory Visit (INDEPENDENT_AMBULATORY_CARE_PROVIDER_SITE_OTHER): Payer: Medicaid Other

## 2019-11-11 VITALS — BP 133/80 | HR 74 | Wt 370.0 lb

## 2019-11-11 DIAGNOSIS — Z3200 Encounter for pregnancy test, result unknown: Secondary | ICD-10-CM | POA: Diagnosis not present

## 2019-11-11 LAB — POCT PREGNANCY, URINE: Preg Test, Ur: POSITIVE — AB

## 2019-11-11 NOTE — Progress Notes (Signed)
Pt here today for UPT; result today is positive. Pt is 4w 3d today with EDD of 07/17/20 based on LMP of 10/11/19. Reviewed medications and allergies with patient. List of medication safe to take during pregnancy given. Denies any vaginal bleeding or pain. Reports nausea; encouraged pt to try otc Unisom and B6, instructions given on otc medication list. Pt concerned that this is a high risk pregnancy and she will need to begin care. Explained prenatal schedule, beginning around 10 weeks. Pt concerned about need for [place]; explained pt will be scheduled for anatomy US around 20w. Explained pt will only be scheduled for [place] if provider feels this is medically indicated. Pt agreeable to plan of care and would like to schedule with our office. Will follow up as needed prior to new ob appts.   Fleet Contras RN 11/11/19

## 2019-11-17 NOTE — Progress Notes (Signed)
Chart reviewed for nurse visit. Agree with plan of care.   Jahmir Salo Lorraine, CNM 11/17/2019 1:17 PM   

## 2019-12-09 ENCOUNTER — Encounter: Payer: Self-pay | Admitting: General Practice

## 2019-12-09 ENCOUNTER — Telehealth (INDEPENDENT_AMBULATORY_CARE_PROVIDER_SITE_OTHER): Payer: Medicaid Other | Admitting: General Practice

## 2019-12-09 ENCOUNTER — Other Ambulatory Visit: Payer: Self-pay

## 2019-12-09 VITALS — Ht 64.5 in

## 2019-12-09 DIAGNOSIS — O09529 Supervision of elderly multigravida, unspecified trimester: Secondary | ICD-10-CM | POA: Insufficient documentation

## 2019-12-09 DIAGNOSIS — O0990 Supervision of high risk pregnancy, unspecified, unspecified trimester: Secondary | ICD-10-CM

## 2019-12-09 DIAGNOSIS — Z98891 History of uterine scar from previous surgery: Secondary | ICD-10-CM | POA: Insufficient documentation

## 2019-12-09 DIAGNOSIS — O09521 Supervision of elderly multigravida, first trimester: Secondary | ICD-10-CM

## 2019-12-09 DIAGNOSIS — O219 Vomiting of pregnancy, unspecified: Secondary | ICD-10-CM

## 2019-12-09 DIAGNOSIS — Z8751 Personal history of pre-term labor: Secondary | ICD-10-CM | POA: Insufficient documentation

## 2019-12-09 DIAGNOSIS — Z3400 Encounter for supervision of normal first pregnancy, unspecified trimester: Secondary | ICD-10-CM | POA: Diagnosis not present

## 2019-12-09 DIAGNOSIS — Z87898 Personal history of other specified conditions: Secondary | ICD-10-CM | POA: Insufficient documentation

## 2019-12-09 MED ORDER — PROMETHAZINE HCL 25 MG PO TABS: 25.0000 mg | ORAL_TABLET | Freq: Four times a day (QID) | ORAL | 0 refills | Status: DC | PRN

## 2019-12-09 MED ORDER — BLOOD PRESSURE KIT: 1.0000 | PACK | Freq: Once | 0 refills | Status: AC

## 2019-12-09 NOTE — Progress Notes (Addendum)
New OB Intake  I connected with  Danielle [name] on 12/09/19 at  8:15 AM EST by MyChart and verified that I am speaking with the correct person using two identifiers. Nurse is located at [facility] and pt is located at home.  I discussed the limitations, risks, security and privacy concerns of performing an evaluation and management service by telephone and the availability of in person appointments. I also discussed with the patient that there may be a patient responsible charge related to this service. The patient expressed understanding and agreed to proceed.  I explained I am completing New OB Intake today. We discussed her EDD of 07/17/20 that is based on LMP of 10/11/19. Pt is G5/P2. I reviewed her allergies, medications, Medical/Surgical/OB history, and appropriate screenings. Based on history, this is a high risk pregnancy complicated by AMA, hx of preterm delivery and c-section. Patient also has history of possible IUGR with past pregnancies. Patient's medical history is significant for hx of gastric sleeve surgery in 2015.   Concerns addressed today:  -Patient's nausea has significantly worsened this past week- has tried Unisom at nighttime with some relief. Phenergan Rx sent to pharmacy per protocol. Discussed we can review effectiveness at new OB appt next Tuesday 11/30. -Hx of Ectopic Pregnancy earlier this year. Patient is requesting first trimester ultrasound for reassurance and auscultation of FHR. Discussed first ultrasounds in our office is usually done around 19 weeks. We will listen to FHR at initial visit next week. Advised she discuss concerns/request for ultrasound with doctor at her appt next week. -General Restrictions letter created for patient to allow for frequent breaks at her job.  MyChart/Babyscripts MyChart access verified. Babyscripts instructions given. Order created in Epic for patient to complete registration.   Blood Pressure Cuff Blood pressure cuff ordered for  patient to pick-up from Danielle [name]. Explained after first prenatal appt pt will check weekly and document in Babyscripts. Requested she bring cuff to initial prenatal visit so we can verify accuracy.   Anatomy US Explained first scheduled [place] will be around 19 weeks. Anatomy US scheduled for February 8th at 8:15am.   Labs Discussed Danielle [name] genetic screening with patient. Would like both Panorama and Horizon drawn- discussed will be too early next week but we can schedule lab visit for following week. Routine prenatal labs needed as well as GC/CH testing, urine culture & possible A1C. Offer Flu vaccine.   First visit review I reviewed new OB appt with pt. I explained she will have  ob bloodwork with genetic screening. Pap smear is not indicated- completed in March 2021. Explained pt will be seen by Danielle [name] at first visit; encounter routed to appropriate provider.  Danielle Pearson, RN 12/09/2019  8:27 AM

## 2019-12-14 NOTE — Progress Notes (Signed)
Patient was assessed and managed by nursing staff during this encounter. I have reviewed the chart and agree with the documentation and plan. I have also made any necessary editorial changes.  Warden Fillers, MD 12/14/2019 10:04 PM

## 2019-12-16 ENCOUNTER — Other Ambulatory Visit: Payer: Self-pay

## 2019-12-16 ENCOUNTER — Ambulatory Visit (INDEPENDENT_AMBULATORY_CARE_PROVIDER_SITE_OTHER): Payer: Medicaid Other | Admitting: Obstetrics and Gynecology

## 2019-12-16 ENCOUNTER — Other Ambulatory Visit (HOSPITAL_COMMUNITY)
Admission: RE | Admit: 2019-12-16 | Discharge: 2019-12-16 | Disposition: A | Discharge status: Home / Self Care | Payer: Medicaid Other | Source: Ambulatory Visit | Attending: Obstetrics and Gynecology | Admitting: Obstetrics and Gynecology

## 2019-12-16 VITALS — BP 124/68 | HR 94 | Wt 387.4 lb

## 2019-12-16 DIAGNOSIS — O0990 Supervision of high risk pregnancy, unspecified, unspecified trimester: Secondary | ICD-10-CM | POA: Diagnosis not present

## 2019-12-16 DIAGNOSIS — D560 Alpha thalassemia: Secondary | ICD-10-CM

## 2019-12-16 DIAGNOSIS — Z6791 Unspecified blood type, Rh negative: Secondary | ICD-10-CM | POA: Diagnosis not present

## 2019-12-16 DIAGNOSIS — Z3A09 9 weeks gestation of pregnancy: Secondary | ICD-10-CM | POA: Insufficient documentation

## 2019-12-16 DIAGNOSIS — O26899 Other specified pregnancy related conditions, unspecified trimester: Secondary | ICD-10-CM | POA: Diagnosis not present

## 2019-12-16 DIAGNOSIS — Z8751 Personal history of pre-term labor: Secondary | ICD-10-CM

## 2019-12-16 DIAGNOSIS — O09521 Supervision of elderly multigravida, first trimester: Secondary | ICD-10-CM

## 2019-12-16 LAB — POCT URINALYSIS DIP (DEVICE)
Bilirubin Urine: NEGATIVE
Glucose, UA: NEGATIVE mg/dL
Hgb urine dipstick: NEGATIVE
Ketones, ur: NEGATIVE mg/dL
Leukocytes,Ua: NEGATIVE
Nitrite: NEGATIVE
Protein, ur: NEGATIVE mg/dL
Specific Gravity, Urine: 1.025 (ref 1.005–1.030)
Urobilinogen, UA: 0.2 mg/dL (ref 0.0–1.0)
pH: 7 (ref 5.0–8.0)

## 2019-12-16 NOTE — Progress Notes (Signed)
INITIAL PRENATAL VISIT NOTE  Subjective:  Danielle [name] is a 34 y.o. [id number] at [redacted]w[redacted]d by LMP being seen today for her initial prenatal visit. This is a planned pregnancy.  She was using depo provera for birth control previously. She has an obstetric history significant for c section x 2. She has a medical history significant for alpha thallasemia, both parents were silent carriers and maternal obesity..  Patient reports nausea.  Contractions: Not present. Vag. Bleeding: None.   . Denies leaking of fluid.    Past Medical History:  Diagnosis Date  . Degenerative arthritis of knee    left   . Hx of alpha thalassemia     Past Surgical History:  Procedure Laterality Date  . CESAREAN SECTION    . LAPAROSCOPIC GASTRIC SLEEVE RESECTION      OB History  Gravida Para Term Preterm AB Living  5 2 1 1 2 2   SAB TAB Ectopic Multiple Live Births  1 0 1 0 2    # Outcome Date GA Lbr Len/2nd Weight Sex Delivery Anes PTL Lv  5 Current           4 Ectopic 01/2019          3 Term 06/04/17 [redacted]w[redacted]d  5 lb 13 oz (2.637 kg) F CS-Unspec   LIV  2 Preterm 06/22/16 [redacted]w[redacted]d  4 lb 4 oz (1.928 kg) F CS-Unspec   LIV     Birth Comments: c-section due to breech  1 SAB 2008            Obstetric Comments  2nd pregnancy delivered at 36 weeks  Prior deliveries in 2009     Social History   Socioeconomic History  . Marital status: Married    Spouse name: Not on file  . Number of children: Not on file  . Years of education: Not on file  . Highest education level: Not on file  Occupational History  . Not on file  Tobacco Use  . Smoking status: Never Smoker  . Smokeless tobacco: Never Used  Vaping Use  . Vaping Use: Former  Substance and Sexual Activity  . Alcohol use: Never  . Drug use: Never  . Sexual activity: Yes  Other Topics Concern  . Not on file  Social History Narrative  . Not on file   Social Determinants of Health   Financial Resource Strain:   . Difficulty of Paying  Living Expenses: Not on file  Food Insecurity:   . Worried About [place] in the Last Year: Not on file  . Ran Out of Food in the Last Year: Not on file  Transportation Needs:   . Lack of Transportation (Medical): Not on file  . Lack of Transportation (Non-Medical): Not on file  Physical Activity:   . Days of Exercise per Week: Not on file  . Minutes of Exercise per Session: Not on file  Stress:   . Feeling of Stress : Not on file  Social Connections:   . Frequency of Communication with Friends and Family: Not on file  . Frequency of Social Gatherings with Friends and Family: Not on file  . Attends Religious Services: Not on file  . Active Member of Clubs or Organizations: Not on file  . Attends Programme researcher, broadcasting/film/video Meetings: Not on file  . Marital Status: Not on file    Family History  Problem Relation Age of Onset  . Leukemia Mother   . Renal Disease Mother   .  Diabetes Father   . Hypertension Father   . Prostate cancer Father   . Dementia Father   . Diabetes Maternal Grandmother   . Hypertension Maternal Grandmother   . Arthritis Maternal Grandfather      Current Outpatient Medications:  .  acetaminophen (TYLENOL) 500 MG tablet, Take 500 mg by mouth every 6 (six) hours as needed., Disp: , Rfl:  .  doxylamine, Sleep, (UNISOM) 25 MG tablet, Take 25 mg by mouth at bedtime as needed., Disp: , Rfl:  .  Prenatal MV & Min w/FA-DHA (PRENATAL GUMMIES PO), Take by mouth., Disp: , Rfl:  .  promethazine (PHENERGAN) 25 MG tablet, Take 1 tablet (25 mg total) by mouth every 6 (six) hours as needed for nausea or vomiting. (Patient not taking: Reported on 12/16/2019), Disp: 30 tablet, Rfl: 0  Allergies  Allergen Reactions  . Nsaids     Gastric sleeve surgery- contraindicated    Review of Systems: Negative except for what is mentioned in HPI.  Objective:   Vitals:   12/16/19 0954  BP: 124/68  Pulse: 94  Weight: (!) 387 lb 6.4 oz (175.7 kg)    Fetal Status:            Physical Exam: BP 124/68   Pulse 94   Wt (!) 387 lb 6.4 oz (175.7 kg)   LMP 10/11/2019 (Exact Date)   BMI 65.47 kg/m  CONSTITUTIONAL: Well-developed, well-nourished female in no acute distress.  NEUROLOGIC: Alert and oriented to person, place, and time. Normal reflexes, muscle tone coordination. No cranial nerve deficit noted. PSYCHIATRIC: Normal mood and affect. Normal behavior. Normal judgment and thought content. SKIN: Skin is warm and dry. No rash noted. Not diaphoretic. No erythema. No pallor. HENT:  Normocephalic, atraumatic, External right and left ear normal. Oropharynx is clear and moist EYES: Conjunctivae and EOM are normal. Pupils are equal, round, and reactive to light. No scleral icterus.  NECK: Normal range of motion, supple, no masses CARDIOVASCULAR: Normal heart rate noted, regular rhythm RESPIRATORY: Effort and breath sounds normal, no problems with respiration noted BREASTS: deferred ABDOMEN: Soft, nontender, nondistended, gravid, obese GU: deferred MUSCULOSKELETAL: Normal range of motion.  Bedside u/s showed viable IUP with FHT 176   Assessment and Plan:  Pregnancy: [id number] at [redacted]w[redacted]d by LMP  1. Rh negative state in antepartum period Routine rhogam in pregnancy  2. Supervision of high risk pregnancy, antepartum Will order early 2 hour GTT, discussed baby aspirin, but pt may not be able to take it due to her gastric sleeve, she will try to discuss with her previous surgeon. - GC/Chlamydia probe amp ([place])not at [facility] - Culture, OB Urine - Hemoglobin A1c - CBC/D/Plt+RPR+Rh+ABO+Rub Ab...  3. Multigravida of advanced maternal age in first trimester  4. History of preterm delivery Delivered at 36 weeks due to PROM  5. [redacted] weeks gestation of pregnancy  6. Alpha thalassemia, per pt she never gets severe anemia, but sometimes has difficulty taking oral supplementation   Preterm labor symptoms and general obstetric precautions including but not limited  to vaginal bleeding, contractions, leaking of fluid and fetal movement were reviewed in detail with the patient.  Please refer to After Visit Summary for other counseling recommendations.   Return in about 4 weeks (around 01/13/2020) for [facility], in person.  Warden Fillers 12/16/2019 10:54 AM

## 2019-12-16 NOTE — Patient Instructions (Signed)
First Trimester of Pregnancy  The first trimester of pregnancy is from week 1 until the end of week 13 (months 1 through 3). During this time, your baby will begin to develop inside you. At 6-8 weeks, the eyes and face are formed, and the heartbeat can be seen on ultrasound. At the end of 12 weeks, all the baby's organs are formed. Prenatal care is all the medical care you receive before the birth of your baby. Make sure you get good prenatal care and follow all of your doctor's instructions. Follow these instructions at home: Medicines  Take over-the-counter and prescription medicines only as told by your doctor. Some medicines are safe and some medicines are not safe during pregnancy.  Take a prenatal vitamin that contains at least 600 micrograms (mcg) of folic acid.  If you have trouble pooping (constipation), take medicine that will make your stool soft (stool softener) if your doctor approves. Eating and drinking   Eat regular, healthy meals.  Your doctor will tell you the amount of weight gain that is right for you.  Avoid raw meat and uncooked cheese.  If you feel sick to your stomach (nauseous) or throw up (vomit): ? Eat 4 or 5 small meals a day instead of 3 large meals. ? Try eating a few soda crackers. ? Drink liquids between meals instead of during meals.  To prevent constipation: ? Eat foods that are high in fiber, like fresh fruits and vegetables, whole grains, and beans. ? Drink enough fluids to keep your pee (urine) clear or pale yellow. Activity  Exercise only as told by your doctor. Stop exercising if you have cramps or pain in your lower belly (abdomen) or low back.  Do not exercise if it is too hot, too humid, or if you are in a place of great height (high altitude).  Try to avoid standing for long periods of time. Move your legs often if you must stand in one place for a long time.  Avoid heavy lifting.  Wear low-heeled shoes. Sit and stand up  straight.  You can have sex unless your doctor tells you not to. Relieving pain and discomfort  Wear a good support bra if your breasts are sore.  Take warm water baths (sitz baths) to soothe pain or discomfort caused by hemorrhoids. Use hemorrhoid cream if your doctor says it is okay.  Rest with your legs raised if you have leg cramps or low back pain.  If you have puffy, bulging veins (varicose veins) in your legs: ? Wear support hose or compression stockings as told by your doctor. ? Raise (elevate) your feet for 15 minutes, 3-4 times a day. ? Limit salt in your food. Prenatal care  Schedule your prenatal visits by the twelfth week of pregnancy.  Write down your questions. Take them to your prenatal visits.  Keep all your prenatal visits as told by your doctor. This is important. Safety  Wear your seat belt at all times when driving.  Make a list of emergency phone numbers. The list should include numbers for family, friends, the hospital, and police and fire departments. General instructions  Ask your doctor for a referral to a local prenatal class. Begin classes no later than at the start of month 6 of your pregnancy.  Ask for help if you need counseling or if you need help with nutrition. Your doctor can give you advice or tell you where to go for help.  Do not use hot tubs, steam   rooms, or saunas.  Do not douche or use tampons or scented sanitary pads.  Do not cross your legs for long periods of time.  Avoid all herbs and alcohol. Avoid drugs that are not approved by your doctor.  Do not use any tobacco products, including cigarettes, chewing tobacco, and electronic cigarettes. If you need help quitting, ask your doctor. You may get counseling or other support to help you quit.  Avoid cat litter boxes and soil used by cats. These carry germs that can cause birth defects in the baby and can cause a loss of your baby (miscarriage) or stillbirth.  Visit your dentist.  At home, brush your teeth with a soft toothbrush. Be gentle when you floss. Contact a doctor if:  You are dizzy.  You have mild cramps or pressure in your lower belly.  You have a nagging pain in your belly area.  You continue to feel sick to your stomach, you throw up, or you have watery poop (diarrhea).  You have a bad smelling fluid coming from your vagina.  You have pain when you pee (urinate).  You have increased puffiness (swelling) in your face, hands, legs, or ankles. Get help right away if:  You have a fever.  You are leaking fluid from your vagina.  You have spotting or bleeding from your vagina.  You have very bad belly cramping or pain.  You gain or lose weight rapidly.  You throw up blood. It may look like coffee grounds.  You are around people who have German measles, fifth disease, or chickenpox.  You have a very bad headache.  You have shortness of breath.  You have any kind of trauma, such as from a fall or a car accident. Summary  The first trimester of pregnancy is from week 1 until the end of week 13 (months 1 through 3).  To take care of yourself and your unborn baby, you will need to eat healthy meals, take medicines only if your doctor tells you to do so, and do activities that are safe for you and your baby.  Keep all follow-up visits as told by your doctor. This is important as your doctor will have to ensure that your baby is healthy and growing well. This information is not intended to replace advice given to you by your health care provider. Make sure you discuss any questions you have with your health care provider. Document Revised: 04/25/2018 Document Reviewed: 01/11/2016 Elsevier Patient Education  2020 [employer]  

## 2019-12-17 ENCOUNTER — Encounter: Payer: Self-pay | Admitting: *Deleted

## 2019-12-17 LAB — HEMOGLOBIN A1C
Est. average glucose Bld gHb Est-mCnc: 82 mg/dL
Hgb A1c MFr Bld: 4.5 % — ABNORMAL LOW (ref 4.8–5.6)

## 2019-12-17 LAB — CBC/D/PLT+RPR+RH+ABO+RUB AB...
Antibody Screen: NEGATIVE
Basophils Absolute: 0 10*3/uL (ref 0.0–0.2)
Basos: 0 %
EOS (ABSOLUTE): 0 10*3/uL (ref 0.0–0.4)
Eos: 0 %
HCV Ab: 0.2 s/co ratio (ref 0.0–0.9)
HIV Screen 4th Generation wRfx: NONREACTIVE
Hematocrit: 34.5 % (ref 34.0–46.6)
Hemoglobin: 10.6 g/dL — ABNORMAL LOW (ref 11.1–15.9)
Hepatitis B Surface Ag: NEGATIVE
Immature Grans (Abs): 0 10*3/uL (ref 0.0–0.1)
Immature Granulocytes: 0 %
Lymphocytes Absolute: 1.3 10*3/uL (ref 0.7–3.1)
Lymphs: 12 %
MCH: 24.3 pg — ABNORMAL LOW (ref 26.6–33.0)
MCHC: 30.7 g/dL — ABNORMAL LOW (ref 31.5–35.7)
MCV: 79 fL (ref 79–97)
Monocytes Absolute: 0.9 10*3/uL (ref 0.1–0.9)
Monocytes: 8 %
Neutrophils Absolute: 8.3 10*3/uL — ABNORMAL HIGH (ref 1.4–7.0)
Neutrophils: 80 %
Platelets: 331 10*3/uL (ref 150–450)
RBC: 4.37 x10E6/uL (ref 3.77–5.28)
RDW: 12.5 % (ref 11.7–15.4)
RPR Ser Ql: NONREACTIVE
Rh Factor: NEGATIVE
Rubella Antibodies, IGG: 1.86 index (ref >0.99)
WBC: 10.6 10*3/uL (ref 3.4–10.8)

## 2019-12-17 LAB — GC/CHLAMYDIA PROBE AMP (~~LOC~~) NOT AT ARMC
Chlamydia: NEGATIVE
Comment: NEGATIVE
Comment: NORMAL
Neisseria Gonorrhea: NEGATIVE

## 2019-12-17 LAB — HCV INTERPRETATION

## 2019-12-18 LAB — CULTURE, OB URINE

## 2019-12-18 LAB — URINE CULTURE, OB REFLEX

## 2019-12-30 ENCOUNTER — Other Ambulatory Visit: Payer: Self-pay | Admitting: Obstetrics and Gynecology

## 2019-12-30 ENCOUNTER — Other Ambulatory Visit: Payer: Self-pay

## 2019-12-30 ENCOUNTER — Ambulatory Visit
Admission: RE | Admit: 2019-12-30 | Discharge: 2019-12-30 | Disposition: A | Discharge status: Home / Self Care | Payer: Medicaid Other | Source: Ambulatory Visit | Attending: Obstetrics and Gynecology | Admitting: Obstetrics and Gynecology

## 2019-12-30 DIAGNOSIS — D569 Thalassemia, unspecified: Secondary | ICD-10-CM | POA: Diagnosis not present

## 2019-12-30 DIAGNOSIS — O0990 Supervision of high risk pregnancy, unspecified, unspecified trimester: Secondary | ICD-10-CM | POA: Insufficient documentation

## 2019-12-30 DIAGNOSIS — O26892 Other specified pregnancy related conditions, second trimester: Secondary | ICD-10-CM | POA: Diagnosis not present

## 2020-01-12 ENCOUNTER — Other Ambulatory Visit: Payer: Self-pay

## 2020-01-12 ENCOUNTER — Other Ambulatory Visit: Payer: Medicaid Other

## 2020-01-12 DIAGNOSIS — O0990 Supervision of high risk pregnancy, unspecified, unspecified trimester: Secondary | ICD-10-CM

## 2020-01-13 ENCOUNTER — Ambulatory Visit (INDEPENDENT_AMBULATORY_CARE_PROVIDER_SITE_OTHER): Payer: Medicaid Other | Admitting: Family Medicine

## 2020-01-13 ENCOUNTER — Other Ambulatory Visit: Payer: Medicaid Other

## 2020-01-13 ENCOUNTER — Other Ambulatory Visit: Payer: Self-pay

## 2020-01-13 ENCOUNTER — Encounter: Payer: Self-pay | Admitting: Family Medicine

## 2020-01-13 VITALS — BP 137/84 | HR 89 | Wt 394.3 lb

## 2020-01-13 DIAGNOSIS — O0990 Supervision of high risk pregnancy, unspecified, unspecified trimester: Secondary | ICD-10-CM

## 2020-01-13 DIAGNOSIS — Z98891 History of uterine scar from previous surgery: Secondary | ICD-10-CM

## 2020-01-13 DIAGNOSIS — O09521 Supervision of elderly multigravida, first trimester: Secondary | ICD-10-CM

## 2020-01-13 DIAGNOSIS — Z87898 Personal history of other specified conditions: Secondary | ICD-10-CM

## 2020-01-13 DIAGNOSIS — Z8751 Personal history of pre-term labor: Secondary | ICD-10-CM

## 2020-01-13 DIAGNOSIS — O09891 Supervision of other high risk pregnancies, first trimester: Secondary | ICD-10-CM | POA: Diagnosis not present

## 2020-01-13 DIAGNOSIS — O26899 Other specified pregnancy related conditions, unspecified trimester: Secondary | ICD-10-CM

## 2020-01-13 DIAGNOSIS — Z6791 Unspecified blood type, Rh negative: Secondary | ICD-10-CM

## 2020-01-13 DIAGNOSIS — Z315 Encounter for genetic counseling: Secondary | ICD-10-CM | POA: Diagnosis not present

## 2020-01-13 NOTE — Addendum Note (Signed)
Addended by: Maxwell Marion E on: 01/13/2020 12:39 PM   Modules accepted: Orders

## 2020-01-13 NOTE — Patient Instructions (Signed)
 Second Trimester of Pregnancy The second trimester is from week 14 through week 27 (months 4 through 6). The second trimester is often a time when you feel your best. Your body has adjusted to being pregnant, and you begin to feel better physically. Usually, morning sickness has lessened or quit completely, you may have more energy, and you may have an increase in appetite. The second trimester is also a time when the fetus is growing rapidly. At the end of the sixth month, the fetus is about 9 inches long and weighs about 1 pounds. You will likely begin to feel the baby move (quickening) between 16 and 20 weeks of pregnancy. Body changes during your second trimester Your body continues to go through many changes during your second trimester. The changes vary from woman to woman.  Your weight will continue to increase. You will notice your lower abdomen bulging out.  You may begin to get stretch marks on your hips, abdomen, and breasts.  You may develop headaches that can be relieved by medicines. The medicines should be approved by your health care provider.  You may urinate more often because the fetus is pressing on your bladder.  You may develop or continue to have heartburn as a result of your pregnancy.  You may develop constipation because certain hormones are causing the muscles that push waste through your intestines to slow down.  You may develop hemorrhoids or swollen, bulging veins (varicose veins).  You may have back pain. This is caused by: ? Weight gain. ? Pregnancy hormones that are relaxing the joints in your pelvis. ? A shift in weight and the muscles that support your balance.  Your breasts will continue to grow and they will continue to become tender.  Your gums may bleed and may be sensitive to brushing and flossing.  Dark spots or blotches (chloasma, mask of pregnancy) may develop on your face. This will likely fade after the baby is born.  A dark line from  your belly button to the pubic area (linea nigra) may appear. This will likely fade after the baby is born.  You may have changes in your hair. These can include thickening of your hair, rapid growth, and changes in texture. Some women also have hair loss during or after pregnancy, or hair that feels dry or thin. Your hair will most likely return to normal after your baby is born. What to expect at prenatal visits During a routine prenatal visit:  You will be weighed to make sure you and the fetus are growing normally.  Your blood pressure will be taken.  Your abdomen will be measured to track your baby's growth.  The fetal heartbeat will be listened to.  Any test results from the previous visit will be discussed. Your health care provider may ask you:  How you are feeling.  If you are feeling the baby move.  If you have had any abnormal symptoms, such as leaking fluid, bleeding, severe headaches, or abdominal cramping.  If you are using any tobacco products, including cigarettes, chewing tobacco, and electronic cigarettes.  If you have any questions. Other tests that may be performed during your second trimester include:  Blood tests that check for: ? Low iron levels (anemia). ? High blood sugar that affects pregnant women (gestational diabetes) between 24 and 28 weeks. ? Rh antibodies. This is to check for a protein on red blood cells (Rh factor).  Urine tests to check for infections, diabetes, or protein in   the urine.  An ultrasound to confirm the proper growth and development of the baby.  An amniocentesis to check for possible genetic problems.  Fetal screens for spina bifida and Down syndrome.  HIV (human immunodeficiency virus) testing. Routine prenatal testing includes screening for HIV, unless you choose not to have this test. Follow these instructions at home: Medicines  Follow your health care provider's instructions regarding medicine use. Specific medicines  may be either safe or unsafe to take during pregnancy.  Take a prenatal vitamin that contains at least 600 micrograms (mcg) of folic acid.  If you develop constipation, try taking a stool softener if your health care provider approves. Eating and drinking   Eat a balanced diet that includes fresh fruits and vegetables, whole grains, good sources of protein such as meat, eggs, or tofu, and low-fat dairy. Your health care provider will help you determine the amount of weight gain that is right for you.  Avoid raw meat and uncooked cheese. These carry germs that can cause birth defects in the baby.  If you have low calcium intake from food, talk to your health care provider about whether you should take a daily calcium supplement.  Limit foods that are high in fat and processed sugars, such as fried and sweet foods.  To prevent constipation: ? Drink enough fluid to keep your urine clear or pale yellow. ? Eat foods that are high in fiber, such as fresh fruits and vegetables, whole grains, and beans. Activity  Exercise only as directed by your health care provider. Most women can continue their usual exercise routine during pregnancy. Try to exercise for 30 minutes at least 5 days a week. Stop exercising if you experience uterine contractions.  Avoid heavy lifting, wear low heel shoes, and practice good posture.  A sexual relationship may be continued unless your health care provider directs you otherwise. Relieving pain and discomfort  Wear a good support bra to prevent discomfort from breast tenderness.  Take warm sitz baths to soothe any pain or discomfort caused by hemorrhoids. Use hemorrhoid cream if your health care provider approves.  Rest with your legs elevated if you have leg cramps or low back pain.  If you develop varicose veins, wear support hose. Elevate your feet for 15 minutes, 3-4 times a day. Limit salt in your diet. Prenatal Care  Write down your questions. Take  them to your prenatal visits.  Keep all your prenatal visits as told by your health care provider. This is important. Safety  Wear your seat belt at all times when driving.  Make a list of emergency phone numbers, including numbers for family, friends, the hospital, and police and fire departments. General instructions  Ask your health care provider for a referral to a local prenatal education class. Begin classes no later than the beginning of month 6 of your pregnancy.  Ask for help if you have counseling or nutritional needs during pregnancy. Your health care provider can offer advice or refer you to specialists for help with various needs.  Do not use hot tubs, steam rooms, or saunas.  Do not douche or use tampons or scented sanitary pads.  Do not cross your legs for long periods of time.  Avoid cat litter boxes and soil used by cats. These carry germs that can cause birth defects in the baby and possibly loss of the fetus by miscarriage or stillbirth.  Avoid all smoking, herbs, alcohol, and unprescribed drugs. Chemicals in these products can affect the   formation and growth of the baby.  Do not use any products that contain nicotine or tobacco, such as cigarettes and e-cigarettes. If you need help quitting, ask your health care provider.  Visit your dentist if you have not gone yet during your pregnancy. Use a soft toothbrush to brush your teeth and be gentle when you floss. Contact a health care provider if:  You have dizziness.  You have mild pelvic cramps, pelvic pressure, or nagging pain in the abdominal area.  You have persistent nausea, vomiting, or diarrhea.  You have a bad smelling vaginal discharge.  You have pain when you urinate. Get help right away if:  You have a fever.  You are leaking fluid from your vagina.  You have spotting or bleeding from your vagina.  You have severe abdominal cramping or pain.  You have rapid weight gain or weight loss.  You  have shortness of breath with chest pain.  You notice sudden or extreme swelling of your face, hands, ankles, feet, or legs.  You have not felt your baby move in over an hour.  You have severe headaches that do not go away when you take medicine.  You have vision changes. Summary  The second trimester is from week 14 through week 27 (months 4 through 6). It is also a time when the fetus is growing rapidly.  Your body goes through many changes during pregnancy. The changes vary from woman to woman.  Avoid all smoking, herbs, alcohol, and unprescribed drugs. These chemicals affect the formation and growth your baby.  Do not use any tobacco products, such as cigarettes, chewing tobacco, and e-cigarettes. If you need help quitting, ask your health care provider.  Contact your health care provider if you have any questions. Keep all prenatal visits as told by your health care provider. This is important. This information is not intended to replace advice given to you by your health care provider. Make sure you discuss any questions you have with your health care provider. Document Revised: 04/26/2018 Document Reviewed: 02/08/2016 [employer] Patient Education  2020 [employer]   Contraception Choices Contraception, also called birth control, refers to methods or devices that prevent pregnancy. Hormonal methods Contraceptive implant  A contraceptive implant is a thin, plastic tube that contains a hormone. It is inserted into the upper part of the arm. It can remain in place for up to 3 years. Progestin-only injections Progestin-only injections are injections of progestin, a synthetic form of the hormone progesterone. They are given every 3 months by a health care provider. Birth control pills  Birth control pills are pills that contain hormones that prevent pregnancy. They must be taken once a day, preferably at the same time each day. Birth control patch  The birth control patch  contains hormones that prevent pregnancy. It is placed on the skin and must be changed once a week for three weeks and removed on the fourth week. A prescription is needed to use this method of contraception. Vaginal ring  A vaginal ring contains hormones that prevent pregnancy. It is placed in the vagina for three weeks and removed on the fourth week. After that, the process is repeated with a new ring. A prescription is needed to use this method of contraception. Emergency contraceptive Emergency contraceptives prevent pregnancy after unprotected sex. They come in pill form and can be taken up to 5 days after sex. They work best the sooner they are taken after having sex. Most emergency contraceptives are available   without a prescription. This method should not be used as your only form of birth control. Barrier methods Female condom  A female condom is a thin sheath that is worn over the penis during sex. Condoms keep sperm from going inside a woman's body. They can be used with a spermicide to increase their effectiveness. They should be disposed after a single use. Female condom  A female condom is a soft, loose-fitting sheath that is put into the vagina before sex. The condom keeps sperm from going inside a woman's body. They should be disposed after a single use. Diaphragm  A diaphragm is a soft, dome-shaped barrier. It is inserted into the vagina before sex, along with a spermicide. The diaphragm blocks sperm from entering the uterus, and the spermicide kills sperm. A diaphragm should be left in the vagina for 6-8 hours after sex and removed within 24 hours. A diaphragm is prescribed and fitted by a health care provider. A diaphragm should be replaced every 1-2 years, after giving birth, after gaining more than 15 lb (6.8 kg), and after pelvic surgery. Cervical cap  A cervical cap is a round, soft latex or plastic cup that fits over the cervix. It is inserted into the vagina before sex, along  with spermicide. It blocks sperm from entering the uterus. The cap should be left in place for 6-8 hours after sex and removed within 48 hours. A cervical cap must be prescribed and fitted by a health care provider. It should be replaced every 2 years. Sponge  A sponge is a soft, circular piece of polyurethane foam with spermicide on it. The sponge helps block sperm from entering the uterus, and the spermicide kills sperm. To use it, you make it wet and then insert it into the vagina. It should be inserted before sex, left in for at least 6 hours after sex, and removed and thrown away within 30 hours. Spermicides Spermicides are chemicals that kill or block sperm from entering the cervix and uterus. They can come as a cream, jelly, suppository, foam, or tablet. A spermicide should be inserted into the vagina with an applicator at least 10-15 minutes before sex to allow time for it to work. The process must be repeated every time you have sex. Spermicides do not require a prescription. Intrauterine contraception Intrauterine device (IUD) An IUD is a T-shaped device that is put in a woman's uterus. There are two types:  Hormone IUD.This type contains progestin, a synthetic form of the hormone progesterone. This type can stay in place for 3-5 years.  Copper IUD.This type is wrapped in copper wire. It can stay in place for 10 years.  Permanent methods of contraception Female tubal ligation In this method, a woman's fallopian tubes are sealed, tied, or blocked during surgery to prevent eggs from traveling to the uterus. Hysteroscopic sterilization In this method, a small, flexible insert is placed into each fallopian tube. The inserts cause scar tissue to form in the fallopian tubes and block them, so sperm cannot reach an egg. The procedure takes about 3 months to be effective. Another form of birth control must be used during those 3 months. Female sterilization This is a procedure to tie off the  tubes that carry sperm (vasectomy). After the procedure, the man can still ejaculate fluid (semen). Natural planning methods Natural family planning In this method, a couple does not have sex on days when the woman could become pregnant. Calendar method This means keeping track of the length   of each menstrual cycle, identifying the days when pregnancy can happen, and not having sex on those days. Ovulation method In this method, a couple avoids sex during ovulation. Symptothermal method This method involves not having sex during ovulation. The woman typically checks for ovulation by watching changes in her temperature and in the consistency of cervical mucus. Post-ovulation method In this method, a couple waits to have sex until after ovulation. Summary  Contraception, also called birth control, means methods or devices that prevent pregnancy.  Hormonal methods of contraception include implants, injections, pills, patches, vaginal rings, and emergency contraceptives.  Barrier methods of contraception can include female condoms, female condoms, diaphragms, cervical caps, sponges, and spermicides.  There are two types of IUDs (intrauterine devices). An IUD can be put in a woman's uterus to prevent pregnancy for 3-5 years.  Permanent sterilization can be done through a procedure for males, females, or both.  Natural family planning methods involve not having sex on days when the woman could become pregnant. This information is not intended to replace advice given to you by your health care provider. Make sure you discuss any questions you have with your health care provider. Document Revised: 01/04/2017 Document Reviewed: 02/05/2016 [employer] Patient Education  2020 [employer]   Breastfeeding  Choosing to breastfeed is one of the best decisions you can make for yourself and your baby. A change in hormones during pregnancy causes your breasts to make breast milk in your milk-producing  glands. Hormones prevent breast milk from being released before your baby is born. They also prompt milk flow after birth. Once breastfeeding has begun, thoughts of your baby, as well as his or her sucking or crying, can stimulate the release of milk from your milk-producing glands. Benefits of breastfeeding Research shows that breastfeeding offers many health benefits for infants and mothers. It also offers a cost-free and convenient way to feed your baby. For your baby  Your first milk (colostrum) helps your baby's digestive system to function better.  Special cells in your milk (antibodies) help your baby to fight off infections.  Breastfed babies are less likely to develop asthma, allergies, obesity, or type 2 diabetes. They are also at lower risk for sudden infant death syndrome (SIDS).  Nutrients in breast milk are better able to meet your baby's needs compared to infant formula.  Breast milk improves your baby's brain development. For you  Breastfeeding helps to create a very special bond between you and your baby.  Breastfeeding is convenient. Breast milk costs nothing and is always available at the correct temperature.  Breastfeeding helps to burn calories. It helps you to lose the weight that you gained during pregnancy.  Breastfeeding makes your uterus return faster to its size before pregnancy. It also slows bleeding (lochia) after you give birth.  Breastfeeding helps to lower your risk of developing type 2 diabetes, osteoporosis, rheumatoid arthritis, cardiovascular disease, and breast, ovarian, uterine, and endometrial cancer later in life. Breastfeeding basics Starting breastfeeding  Find a comfortable place to sit or lie down, with your neck and back well-supported.  Place a pillow or a rolled-up blanket under your baby to bring him or her to the level of your breast (if you are seated). Nursing pillows are specially designed to help support your arms and your baby while  you breastfeed.  Make sure that your baby's tummy (abdomen) is facing your abdomen.  Gently massage your breast. With your fingertips, massage from the outer edges of your breast inward toward   the nipple. This encourages milk flow. If your milk flows slowly, you may need to continue this action during the feeding.  Support your breast with 4 fingers underneath and your thumb above your nipple (make the letter "C" with your hand). Make sure your fingers are well away from your nipple and your baby's mouth.  Stroke your baby's lips gently with your finger or nipple.  When your baby's mouth is open wide enough, quickly bring your baby to your breast, placing your entire nipple and as much of the areola as possible into your baby's mouth. The areola is the colored area around your nipple. ? More areola should be visible above your baby's upper lip than below the lower lip. ? Your baby's lips should be opened and extended outward (flanged) to ensure an adequate, comfortable latch. ? Your baby's tongue should be between his or her lower gum and your breast.  Make sure that your baby's mouth is correctly positioned around your nipple (latched). Your baby's lips should create a seal on your breast and be turned out (everted).  It is common for your baby to suck about 2-3 minutes in order to start the flow of breast milk. Latching Teaching your baby how to latch onto your breast properly is very important. An improper latch can cause nipple pain, decreased milk supply, and poor weight gain in your baby. Also, if your baby is not latched onto your nipple properly, he or she may swallow some air during feeding. This can make your baby fussy. Burping your baby when you switch breasts during the feeding can help to get rid of the air. However, teaching your baby to latch on properly is still the best way to prevent fussiness from swallowing air while breastfeeding. Signs that your baby has successfully  latched onto your nipple  Silent tugging or silent sucking, without causing you pain. Infant's lips should be extended outward (flanged).  Swallowing heard between every 3-4 sucks once your milk has started to flow (after your let-down milk reflex occurs).  Muscle movement above and in front of his or her ears while sucking. Signs that your baby has not successfully latched onto your nipple  Sucking sounds or smacking sounds from your baby while breastfeeding.  Nipple pain. If you think your baby has not latched on correctly, slip your finger into the corner of your baby's mouth to break the suction and place it between your baby's gums. Attempt to start breastfeeding again. Signs of successful breastfeeding Signs from your baby  Your baby will gradually decrease the number of sucks or will completely stop sucking.  Your baby will fall asleep.  Your baby's body will relax.  Your baby will retain a small amount of milk in his or her mouth.  Your baby will let go of your breast by himself or herself. Signs from you  Breasts that have increased in firmness, weight, and size 1-3 hours after feeding.  Breasts that are softer immediately after breastfeeding.  Increased milk volume, as well as a change in milk consistency and color by the fifth day of breastfeeding.  Nipples that are not sore, cracked, or bleeding. Signs that your baby is getting enough milk  Wetting at least 1-2 diapers during the first 24 hours after birth.  Wetting at least 5-6 diapers every 24 hours for the first week after birth. The urine should be clear or pale yellow by the age of 5 days.  Wetting 6-8 diapers every 24 hours as   your baby continues to grow and develop.  At least 3 stools in a 24-hour period by the age of 5 days. The stool should be soft and yellow.  At least 3 stools in a 24-hour period by the age of 7 days. The stool should be seedy and yellow.  No loss of weight greater than 10% of  birth weight during the first 3 days of life.  Average weight gain of 4-7 oz (113-198 g) per week after the age of 4 days.  Consistent daily weight gain by the age of 5 days, without weight loss after the age of 2 weeks. After a feeding, your baby may spit up a small amount of milk. This is normal. Breastfeeding frequency and duration Frequent feeding will help you make more milk and can prevent sore nipples and extremely full breasts (breast engorgement). Breastfeed when you feel the need to reduce the fullness of your breasts or when your baby shows signs of hunger. This is called "breastfeeding on demand." Signs that your baby is hungry include:  Increased alertness, activity, or restlessness.  Movement of the head from side to side.  Opening of the mouth when the corner of the mouth or cheek is stroked (rooting).  Increased sucking sounds, smacking lips, cooing, sighing, or squeaking.  Hand-to-mouth movements and sucking on fingers or hands.  Fussing or crying. Avoid introducing a pacifier to your baby in the first 4-6 weeks after your baby is born. After this time, you may choose to use a pacifier. Research has shown that pacifier use during the first year of a baby's life decreases the risk of sudden infant death syndrome (SIDS). Allow your baby to feed on each breast as long as he or she wants. When your baby unlatches or falls asleep while feeding from the first breast, offer the second breast. Because newborns are often sleepy in the first few weeks of life, you may need to awaken your baby to get him or her to feed. Breastfeeding times will vary from baby to baby. However, the following rules can serve as a guide to help you make sure that your baby is properly fed:  Newborns (babies 4 weeks of age or younger) may breastfeed every 1-3 hours.  Newborns should not go without breastfeeding for longer than 3 hours during the day or 5 hours during the night.  You should breastfeed  your baby a minimum of 8 times in a 24-hour period. Breast milk pumping     Pumping and storing breast milk allows you to make sure that your baby is exclusively fed your breast milk, even at times when you are unable to breastfeed. This is especially important if you go back to work while you are still breastfeeding, or if you are not able to be present during feedings. Your lactation consultant can help you find a method of pumping that works best for you and give you guidelines about how long it is safe to store breast milk. Caring for your breasts while you breastfeed Nipples can become dry, cracked, and sore while breastfeeding. The following recommendations can help keep your breasts moisturized and healthy:  Avoid using soap on your nipples.  Wear a supportive bra designed especially for nursing. Avoid wearing underwire-style bras or extremely tight bras (sports bras).  Air-dry your nipples for 3-4 minutes after each feeding.  Use only cotton bra pads to absorb leaked breast milk. Leaking of breast milk between feedings is normal.  Use lanolin on your nipples   after breastfeeding. Lanolin helps to maintain your skin's normal moisture barrier. Pure lanolin is not harmful (not toxic) to your baby. You may also hand express a few drops of breast milk and gently massage that milk into your nipples and allow the milk to air-dry. In the first few weeks after giving birth, some women experience breast engorgement. Engorgement can make your breasts feel heavy, warm, and tender to the touch. Engorgement peaks within 3-5 days after you give birth. The following recommendations can help to ease engorgement:  Completely empty your breasts while breastfeeding or pumping. You may want to start by applying warm, moist heat (in the shower or with warm, water-soaked hand towels) just before feeding or pumping. This increases circulation and helps the milk flow. If your baby does not completely empty your  breasts while breastfeeding, pump any extra milk after he or she is finished.  Apply ice packs to your breasts immediately after breastfeeding or pumping, unless this is too uncomfortable for you. To do this: ? Put ice in a plastic bag. ? Place a towel between your skin and the bag. ? Leave the ice on for 20 minutes, 2-3 times a day.  Make sure that your baby is latched on and positioned properly while breastfeeding. If engorgement persists after 48 hours of following these recommendations, contact your health care provider or a lactation consultant. Overall health care recommendations while breastfeeding  Eat 3 healthy meals and 3 snacks every day. Well-nourished mothers who are breastfeeding need an additional 450-500 calories a day. You can meet this requirement by increasing the amount of a balanced diet that you eat.  Drink enough water to keep your urine pale yellow or clear.  Rest often, relax, and continue to take your prenatal vitamins to prevent fatigue, stress, and low vitamin and mineral levels in your body (nutrient deficiencies).  Do not use any products that contain nicotine or tobacco, such as cigarettes and e-cigarettes. Your baby may be harmed by chemicals from cigarettes that pass into breast milk and exposure to secondhand smoke. If you need help quitting, ask your health care provider.  Avoid alcohol.  Do not use illegal drugs or marijuana.  Talk with your health care provider before taking any medicines. These include over-the-counter and prescription medicines as well as vitamins and herbal supplements. Some medicines that may be harmful to your baby can pass through breast milk.  It is possible to become pregnant while breastfeeding. If birth control is desired, ask your health care provider about options that will be safe while breastfeeding your baby. Where to find more information: [employer]: www.llli.org Contact a health care provider  if:  You feel like you want to stop breastfeeding or have become frustrated with breastfeeding.  Your nipples are cracked or bleeding.  Your breasts are red, tender, or warm.  You have: ? Painful breasts or nipples. ? A swollen area on either breast. ? A fever or chills. ? Nausea or vomiting. ? Drainage other than breast milk from your nipples.  Your breasts do not become full before feedings by the fifth day after you give birth.  You feel sad and depressed.  Your baby is: ? Too sleepy to eat well. ? Having trouble sleeping. ? More than 1 week old and wetting fewer than 6 diapers in a 24-hour period. ? Not gaining weight by 5 days of age.  Your baby has fewer than 3 stools in a 24-hour period.  Your baby's skin or   the white parts of his or her eyes become yellow. Get help right away if:  Your baby is overly tired (lethargic) and does not want to wake up and feed.  Your baby develops an unexplained fever. Summary  Breastfeeding offers many health benefits for infant and mothers.  Try to breastfeed your infant when he or she shows early signs of hunger.  Gently tickle or stroke your baby's lips with your finger or nipple to allow the baby to open his or her mouth. Bring the baby to your breast. Make sure that much of the areola is in your baby's mouth. Offer one side and burp the baby before you offer the other side.  Talk with your health care provider or lactation consultant if you have questions or you face problems as you breastfeed. This information is not intended to replace advice given to you by your health care provider. Make sure you discuss any questions you have with your health care provider. Document Revised: 03/29/2017 Document Reviewed: 02/04/2016 Elsevier Patient Education  2020 Elsevier Inc.  

## 2020-01-13 NOTE — Progress Notes (Signed)
   Subjective:  Danielle [name] is a 34 y.o. [id number] at [redacted]w[redacted]d being seen today for ongoing prenatal care.  She is currently monitored for the following issues for this high-risk pregnancy and has Rh negative state in antepartum period; Anemia; Hirsutism; Supervision of high risk pregnancy, antepartum; Advanced maternal age in multigravida; History of 2 cesarean sections; History of poor fetal growth; History of preterm delivery; [redacted] weeks gestation of pregnancy; and Alpha-thalassemia (HCC) on their problem list.  Patient reports no complaints.  Contractions: Not present. Vag. Bleeding: None.  Movement: Absent. Denies leaking of fluid.   The following portions of the patient's history were reviewed and updated as appropriate: allergies, current medications, past family history, past medical history, past social history, past surgical history and problem list. Problem list updated.  Objective:   Vitals:   01/13/20 1023  BP: 137/84  Pulse: 89  Weight: (!) 394 lb 4.8 oz (178.9 kg)    Fetal Status: Fetal Heart Rate (bpm): 156   Movement: Absent     General:  Alert, oriented and cooperative. Patient is in no acute distress.  Skin: Skin is warm and dry. No rash noted.   Cardiovascular: Normal heart rate noted  Respiratory: Normal respiratory effort, no problems with respiration noted  Abdomen: Soft, gravid, appropriate for gestational age. Pain/Pressure: Absent     Pelvic: Vag. Bleeding: None     Cervical exam deferred        Extremities: Normal range of motion.  Edema: None  Mental Status: Normal mood and affect. Normal behavior. Normal judgment and thought content.   Urinalysis:      Assessment and Plan:  Pregnancy: [id number] at [redacted]w[redacted]d  1. Supervision of high risk pregnancy, antepartum FHR and BP normal Anatomy US scheduled for 02/2020 Early A1c 4.5% and patient vomited in lobby after drinking glucola, do not think she needs to repeat Draw genetic screening today  2. Multigravida of  advanced maternal age in first trimester   3. History of 2 cesarean sections Repeat at 39 weeks Discussed BTL, will consider  4. History of poor fetal growth   5. Rh negative Rhogam at 28 weeks  6. History of preterm delivery Iatrogenic 2/2 breech presentation  Preterm labor symptoms and general obstetric precautions including but not limited to vaginal bleeding, contractions, leaking of fluid and fetal movement were reviewed in detail with the patient. Please refer to After Visit Summary for other counseling recommendations.  Return in 4 weeks (on 02/10/2020).   Venora Maples, MD

## 2020-01-15 ENCOUNTER — Other Ambulatory Visit: Payer: Medicaid Other

## 2020-01-20 ENCOUNTER — Other Ambulatory Visit: Payer: Medicaid Other

## 2020-02-10 ENCOUNTER — Encounter: Payer: Self-pay | Admitting: Family Medicine

## 2020-02-10 ENCOUNTER — Telehealth (INDEPENDENT_AMBULATORY_CARE_PROVIDER_SITE_OTHER): Payer: Medicaid Other | Admitting: Family Medicine

## 2020-02-10 VITALS — BP 130/88

## 2020-02-10 DIAGNOSIS — O34219 Maternal care for unspecified type scar from previous cesarean delivery: Secondary | ICD-10-CM

## 2020-02-10 DIAGNOSIS — O09212 Supervision of pregnancy with history of pre-term labor, second trimester: Secondary | ICD-10-CM

## 2020-02-10 DIAGNOSIS — O360120 Maternal care for anti-D [Rh] antibodies, second trimester, not applicable or unspecified: Secondary | ICD-10-CM

## 2020-02-10 DIAGNOSIS — Z6791 Unspecified blood type, Rh negative: Secondary | ICD-10-CM

## 2020-02-10 DIAGNOSIS — Z8751 Personal history of pre-term labor: Secondary | ICD-10-CM

## 2020-02-10 DIAGNOSIS — O0990 Supervision of high risk pregnancy, unspecified, unspecified trimester: Secondary | ICD-10-CM

## 2020-02-10 DIAGNOSIS — O09521 Supervision of elderly multigravida, first trimester: Secondary | ICD-10-CM

## 2020-02-10 DIAGNOSIS — Z98891 History of uterine scar from previous surgery: Secondary | ICD-10-CM

## 2020-02-10 DIAGNOSIS — O09522 Supervision of elderly multigravida, second trimester: Secondary | ICD-10-CM

## 2020-02-10 DIAGNOSIS — Z3A17 17 weeks gestation of pregnancy: Secondary | ICD-10-CM

## 2020-02-10 NOTE — Progress Notes (Signed)
Called pt at 0840; VM not set up. Pt called back and spoke with front office staff, was instructed to join MyChart.   I connected with  Danielle [name] on 02/10/20 at 0858 by MyChart and verified that I am speaking with the correct person using two identifiers.   I discussed the limitations, risks, security and privacy concerns of performing an evaluation and management service by telephone and the availability of in person appointments. I also discussed with the patient that there may be a patient responsible charge related to this service. The patient expressed understanding and agreed to proceed.  Marjo Bicker, RN 02/10/2020  8:59 AM

## 2020-02-10 NOTE — Patient Instructions (Signed)

## 2020-02-10 NOTE — Progress Notes (Signed)
I connected with Danielle [name] 02/10/20 at  8:35 AM EST by: MyChart video and verified that I am speaking with the correct person using two identifiers.  Patient is located at home and provider is located at [facility].     The purpose of this virtual visit is to provide medical care while limiting exposure to the novel coronavirus. I discussed the limitations, risks, security and privacy concerns of performing an evaluation and management service by MyChart video and the availability of in person appointments. I also discussed with the patient that there may be a patient responsible charge related to this service. By engaging in this virtual visit, you consent to the provision of healthcare.  Additionally, you authorize for your insurance to be billed for the services provided during this visit.  The patient expressed understanding and agreed to proceed.  The following staff members participated in the virtual visit:  Venora Maples, MD/MPH    PRENATAL VISIT NOTE  Subjective:  Danielle [name] is a 35 y.o. [id number] at [redacted]w[redacted]d  for phone visit for ongoing prenatal care.  She is currently monitored for the following issues for this high-risk pregnancy and has Rh negative state in antepartum period; Anemia; Hirsutism; Supervision of high risk pregnancy, antepartum; Advanced maternal age in multigravida; History of 2 cesarean sections; History of poor fetal growth; History of preterm delivery; [redacted] weeks gestation of pregnancy; and Alpha-thalassemia (HCC) on their problem list.  Patient reports no complaints.  Contractions: Not present. Vag. Bleeding: None.  Movement: Absent. Denies leaking of fluid.   The following portions of the patient's history were reviewed and updated as appropriate: allergies, current medications, past family history, past medical history, past social history, past surgical history and problem list.   Objective:   Vitals:   02/10/20 0919  BP: 130/88    Self-Obtained  Fetal Status:     Movement: Absent     Assessment and Plan:  Pregnancy: [id number] at [redacted]w[redacted]d 1. Supervision of high risk pregnancy, antepartum BP normal, not yet feeling movement Scheduled to get COVID vaccine tomorrow, nervous, provided reassurance Discussed contraception, would like BTL. Sign papers at next visit Anatomy scan scheduled for 02/24/2020  2. Multigravida of advanced maternal age in first trimester   3. History of 2 cesarean sections Plan for repeat at 39 weeks  4. Rh negative state in antepartum period Rhogam at 28 weeks  5. History of preterm delivery Iatrogenic in setting of breech presentation with first pregnancy  Preterm labor symptoms and general obstetric precautions including but not limited to vaginal bleeding, contractions, leaking of fluid and fetal movement were reviewed in detail with the patient.  No follow-ups on file.  Future Appointments  Date Time Provider Department Center  02/11/2020  1:50 PM Derrel Nip, MD [facility]  02/24/2020  8:15 AM [facility] NURSE [facility]  02/24/2020  8:30 AM [facility] US3 [facility]     Time spent on virtual visit: 18 minutes  Venora Maples, MD

## 2020-02-11 ENCOUNTER — Ambulatory Visit (INDEPENDENT_AMBULATORY_CARE_PROVIDER_SITE_OTHER): Payer: Medicaid Other | Admitting: Family Medicine

## 2020-02-11 ENCOUNTER — Other Ambulatory Visit: Payer: Self-pay

## 2020-02-11 VITALS — BP 122/78 | HR 85 | Wt >= 6400 oz

## 2020-02-11 DIAGNOSIS — O0990 Supervision of high risk pregnancy, unspecified, unspecified trimester: Secondary | ICD-10-CM

## 2020-02-11 DIAGNOSIS — Z23 Encounter for immunization: Secondary | ICD-10-CM | POA: Insufficient documentation

## 2020-02-11 NOTE — Assessment & Plan Note (Signed)
Patient received the first dose of the COVID-19 vaccine today.  She was scheduled for a nurse visit to return and get second dose.

## 2020-02-11 NOTE — Progress Notes (Signed)
    SUBJECTIVE:   CHIEF COMPLAINT / HPI:   COVID-19 vaccination visit Patient presented to discussed the risks and benefits of the COVID-19 vaccine.  She had discussed this with her OB provider but also wanted to talk to me about it.  The risks and benefits of the COVID-19 vaccine were discussed with patient.  She wishes to receive the first dose of the vaccine today.   OB Patient is a high risk OB patient is following with the high risk clinic.  Saw her provider yesterday.  Denies any vaginal bleeding, loss of fluid, contractions.  Reports good fetal movement.  No concerns at this time.  OBJECTIVE:   BP 122/78   Pulse 85   Wt (!) 402 lb 3.2 oz (182.4 kg)   LMP 10/11/2019 (Exact Date)   BMI 67.97 kg/m   General: Well-appearing, obese 35 year old female, no acute distress Cardiac: Regular rate and rhythm, no murmurs appreciated Respiratory: Normal work of breathing Abdomen: Soft, nontender, positive bowel sounds Fetal heart tones: 140s, appropriate  ASSESSMENT/PLAN:   Supervision of high risk pregnancy, antepartum Patient is here for vaccination visit.  While at visit she had not heard fetal heart tones and would like to.  Fetal heart tones were appropriate in the 140s.  Discussed strict MAU precautions and patient is agreeable that this including vaginal bleeding, loss of fluid, contractions.  She does report that she is feeling fetal movement.  COVID-19 vaccine administered Patient received the first dose of the COVID-19 vaccine today.  She was scheduled for a nurse visit to return and get second dose.   Derrel Nip, MD [facility]

## 2020-02-11 NOTE — Patient Instructions (Signed)
It was wonderful seeing you today.  Congratulations on your pregnancy!  You received the first dose of the COVID-19 vaccine today.  You may feel a little tired, have some arm pain, fevers for the next day or so.  You can take over-the-counter Tylenol for this.  Avoid ibuprofen.  We listen to your baby's heart rate today and it was completely normal.  If you have any questions, issues, concerns please feel free to call the clinic.  If you have any vaginal bleeding, gush of fluid, feel contractions please seek medical attention immediately.  I hope you have a wonderful afternoon!

## 2020-02-11 NOTE — Assessment & Plan Note (Signed)
Patient is here for vaccination visit.  While at visit she had not heard fetal heart tones and would like to.  Fetal heart tones were appropriate in the 140s.  Discussed strict MAU precautions and patient is agreeable that this including vaginal bleeding, loss of fluid, contractions.  She does report that she is feeling fetal movement.

## 2020-02-16 ENCOUNTER — Telehealth: Payer: Self-pay | Admitting: General Practice

## 2020-02-16 ENCOUNTER — Encounter: Payer: Self-pay | Admitting: General Practice

## 2020-02-16 DIAGNOSIS — Z20822 Contact with and (suspected) exposure to covid-19: Secondary | ICD-10-CM | POA: Diagnosis not present

## 2020-02-16 NOTE — Telephone Encounter (Signed)
Called patient to inform her of [facility] test results. Patient verbalized understanding & states she was already aware. Patient & partner are declining genetic testing at this time.

## 2020-02-24 ENCOUNTER — Encounter: Payer: Self-pay | Admitting: *Deleted

## 2020-02-24 ENCOUNTER — Other Ambulatory Visit: Payer: Self-pay | Admitting: *Deleted

## 2020-02-24 ENCOUNTER — Ambulatory Visit: Payer: Medicaid Other | Attending: Obstetrics and Gynecology

## 2020-02-24 ENCOUNTER — Ambulatory Visit: Payer: Medicaid Other | Admitting: *Deleted

## 2020-02-24 ENCOUNTER — Other Ambulatory Visit: Payer: Self-pay

## 2020-02-24 DIAGNOSIS — Z87898 Personal history of other specified conditions: Secondary | ICD-10-CM | POA: Insufficient documentation

## 2020-02-24 DIAGNOSIS — Z98891 History of uterine scar from previous surgery: Secondary | ICD-10-CM

## 2020-02-24 DIAGNOSIS — O0990 Supervision of high risk pregnancy, unspecified, unspecified trimester: Secondary | ICD-10-CM

## 2020-02-24 DIAGNOSIS — Z6841 Body Mass Index (BMI) 40.0 and over, adult: Secondary | ICD-10-CM

## 2020-03-01 ENCOUNTER — Telehealth: Payer: Self-pay

## 2020-03-01 NOTE — Telephone Encounter (Signed)
Patient calls nurse line regarding questions with second COVID vaccine. Patient reports that she was diagnosed with COVID on 2/11, with symptom onset on 2/9. Patient is scheduled for second dose on 2/16. Please advise when patient should receive second COVID vaccine. Of note, patient is [redacted] weeks pregnant.   To PCP  [name], RN

## 2020-03-02 NOTE — Telephone Encounter (Signed)
Patient rescheduled for March 9th.  

## 2020-03-03 ENCOUNTER — Ambulatory Visit: Payer: Medicaid Other

## 2020-03-04 NOTE — Telephone Encounter (Signed)
I believe she can get the vaccine after she is done with her isolation for covid.

## 2020-03-09 ENCOUNTER — Telehealth (INDEPENDENT_AMBULATORY_CARE_PROVIDER_SITE_OTHER): Payer: Medicaid Other | Admitting: Family Medicine

## 2020-03-09 ENCOUNTER — Encounter: Payer: Self-pay | Admitting: Family Medicine

## 2020-03-09 VITALS — BP 143/81

## 2020-03-09 DIAGNOSIS — Z6791 Unspecified blood type, Rh negative: Secondary | ICD-10-CM

## 2020-03-09 DIAGNOSIS — O09212 Supervision of pregnancy with history of pre-term labor, second trimester: Secondary | ICD-10-CM

## 2020-03-09 DIAGNOSIS — O10919 Unspecified pre-existing hypertension complicating pregnancy, unspecified trimester: Secondary | ICD-10-CM | POA: Insufficient documentation

## 2020-03-09 DIAGNOSIS — O0990 Supervision of high risk pregnancy, unspecified, unspecified trimester: Secondary | ICD-10-CM

## 2020-03-09 DIAGNOSIS — Z8751 Personal history of pre-term labor: Secondary | ICD-10-CM

## 2020-03-09 DIAGNOSIS — O98512 Other viral diseases complicating pregnancy, second trimester: Secondary | ICD-10-CM

## 2020-03-09 DIAGNOSIS — O10912 Unspecified pre-existing hypertension complicating pregnancy, second trimester: Secondary | ICD-10-CM

## 2020-03-09 DIAGNOSIS — Z23 Encounter for immunization: Secondary | ICD-10-CM

## 2020-03-09 DIAGNOSIS — O360120 Maternal care for anti-D [Rh] antibodies, second trimester, not applicable or unspecified: Secondary | ICD-10-CM

## 2020-03-09 DIAGNOSIS — Z3A21 21 weeks gestation of pregnancy: Secondary | ICD-10-CM

## 2020-03-09 DIAGNOSIS — Z87898 Personal history of other specified conditions: Secondary | ICD-10-CM

## 2020-03-09 DIAGNOSIS — O26899 Other specified pregnancy related conditions, unspecified trimester: Secondary | ICD-10-CM

## 2020-03-09 DIAGNOSIS — O34219 Maternal care for unspecified type scar from previous cesarean delivery: Secondary | ICD-10-CM

## 2020-03-09 DIAGNOSIS — O09522 Supervision of elderly multigravida, second trimester: Secondary | ICD-10-CM

## 2020-03-09 DIAGNOSIS — Z98891 History of uterine scar from previous surgery: Secondary | ICD-10-CM

## 2020-03-09 DIAGNOSIS — U071 COVID-19: Secondary | ICD-10-CM

## 2020-03-09 NOTE — Progress Notes (Signed)
I connected with Danielle [name] 03/09/20 at  9:35 AM EST by: MyChart video and verified that I am speaking with the correct person using two identifiers.  Patient is located at home and provider is located at [facility].     The purpose of this virtual visit is to provide medical care while limiting exposure to the novel coronavirus. I discussed the limitations, risks, security and privacy concerns of performing an evaluation and management service by MyChart video and the availability of in person appointments. I also discussed with the patient that there may be a patient responsible charge related to this service. By engaging in this virtual visit, you consent to the provision of healthcare.  Additionally, you authorize for your insurance to be billed for the services provided during this visit.  The patient expressed understanding and agreed to proceed.  The following staff members participated in the virtual visit:  Venora Maples, MD/MPH    PRENATAL VISIT NOTE  Subjective:  Danielle [name] is a 35 y.o. [id number] at [redacted]w[redacted]d  for phone visit for ongoing prenatal care.  She is currently monitored for the following issues for this high-risk pregnancy and has Rh negative state in antepartum period; Anemia; Hirsutism; Supervision of high risk pregnancy, antepartum; Advanced maternal age in multigravida; History of 2 cesarean sections; History of poor fetal growth; History of preterm delivery; Alpha-thalassemia (HCC); and COVID-19 vaccine administered on their problem list.  Patient reports no complaints.  Contractions: Not present. Vag. Bleeding: None.  Movement: Present. Denies leaking of fluid.   The following portions of the patient's history were reviewed and updated as appropriate: allergies, current medications, past family history, past medical history, past social history, past surgical history and problem list.   Objective:   Vitals:   03/09/20 0956  BP: (!) 143/81    Self-Obtained  Fetal Status:     Movement: Present     Assessment and Plan:  Pregnancy: [id number] at [redacted]w[redacted]d 1. Supervision of high risk pregnancy, antepartum BP mild range Good fetal movement  2. Multigravida of advanced maternal age in second trimester   3. History of 2 cesarean sections Plan for repeat with BTL at 39 weeks  4. History of poor fetal growth   5. Chronic hypertension during pregnancy Not previously diagnosed before today Mild range BP today and at MFM visit two weeks prior On further review of chart she has had mild range BP's predating this pregnancy Not on any meds Will obtain baseline labs at next visit  6. Rh negative state in antepartum period Rhogam at 28 weeks  7. Advanced maternal age in multigravida  42. COVID-19 Diagnosed earlier this month Numerous questions answered regarding quarantine period and other related questions Has received first dose of COVID vaccine before becoming ill Has been doing well and is outside the 10 day quarantine period with resolving symptoms Scheduled for second shot later this month  Preterm labor symptoms and general obstetric precautions including but not limited to vaginal bleeding, contractions, leaking of fluid and fetal movement were reviewed in detail with the patient.  Return in 4 weeks (on 04/06/2020).  Future Appointments  Date Time Provider Department Center  03/23/2020  9:45 AM [facility] NURSE [facility]  03/23/2020 10:00 AM [facility] US1 [facility]  03/24/2020  9:00 AM [facility] NURSE [facility]     Time spent on virtual visit: 15 minutes  Venora Maples, MD

## 2020-03-09 NOTE — Progress Notes (Signed)
I connected with  Danielle [name] on 03/09/20 at 0952 by MyChart and verified that I am speaking with the correct person using two identifiers.   I discussed the limitations, risks, security and privacy concerns of performing an evaluation and management service by telephone and the availability of in person appointments. I also discussed with the patient that there may be a patient responsible charge related to this service. The patient expressed understanding and agreed to proceed.  Marjo Bicker, RN 03/09/2020  9:52 AM

## 2020-03-09 NOTE — Patient Instructions (Signed)
 Contraception Choices Contraception, also called birth control, refers to methods or devices that prevent pregnancy. Hormonal methods Contraceptive implant A contraceptive implant is a thin, plastic tube that contains a hormone that prevents pregnancy. It is different from an intrauterine device (IUD). It is inserted into the upper part of the arm by a health care provider. Implants can be effective for up to 3 years. Progestin-only injections Progestin-only injections are injections of progestin, a synthetic form of the hormone progesterone. They are given every 3 months by a health care provider. Birth control pills Birth control pills are pills that contain hormones that prevent pregnancy. They must be taken once a day, preferably at the same time each day. A prescription is needed to use this method of contraception. Birth control patch The birth control patch contains hormones that prevent pregnancy. It is placed on the skin and must be changed once a week for three weeks and removed on the fourth week. A prescription is needed to use this method of contraception. Vaginal ring A vaginal ring contains hormones that prevent pregnancy. It is placed in the vagina for three weeks and removed on the fourth week. After that, the process is repeated with a new ring. A prescription is needed to use this method of contraception. Emergency contraceptive Emergency contraceptives prevent pregnancy after unprotected sex. They come in pill form and can be taken up to 5 days after sex. They work best the sooner they are taken after having sex. Most emergency contraceptives are available without a prescription. This method should not be used as your only form of birth control.   Barrier methods Female condom A female condom is a thin sheath that is worn over the penis during sex. Condoms keep sperm from going inside a woman's body. They can be used with a sperm-killing substance (spermicide) to increase their  effectiveness. They should be thrown away after one use. Female condom A female condom is a soft, loose-fitting sheath that is put into the vagina before sex. The condom keeps sperm from going inside a woman's body. They should be thrown away after one use. Diaphragm A diaphragm is a soft, dome-shaped barrier. It is inserted into the vagina before sex, along with a spermicide. The diaphragm blocks sperm from entering the uterus, and the spermicide kills sperm. A diaphragm should be left in the vagina for 6-8 hours after sex and removed within 24 hours. A diaphragm is prescribed and fitted by a health care provider. A diaphragm should be replaced every 1-2 years, after giving birth, after gaining more than 15 lb (6.8 kg), and after pelvic surgery. Cervical cap A cervical cap is a round, soft latex or plastic cup that fits over the cervix. It is inserted into the vagina before sex, along with spermicide. It blocks sperm from entering the uterus. The cap should be left in place for 6-8 hours after sex and removed within 48 hours. A cervical cap must be prescribed and fitted by a health care provider. It should be replaced every 2 years. Sponge A sponge is a soft, circular piece of polyurethane foam with spermicide in it. The sponge helps block sperm from entering the uterus, and the spermicide kills sperm. To use it, you make it wet and then insert it into the vagina. It should be inserted before sex, left in for at least 6 hours after sex, and removed and thrown away within 30 hours. Spermicides Spermicides are chemicals that kill or block sperm from entering the   cervix and uterus. They can come as a cream, jelly, suppository, foam, or tablet. A spermicide should be inserted into the vagina with an applicator at least 10-15 minutes before sex to allow time for it to work. The process must be repeated every time you have sex. Spermicides do not require a prescription.   Intrauterine  contraception Intrauterine device (IUD) An IUD is a T-shaped device that is put in a woman's uterus. There are two types:  Hormone IUD.This type contains progestin, a synthetic form of the hormone progesterone. This type can stay in place for 3-5 years.  Copper IUD.This type is wrapped in copper wire. It can stay in place for 10 years. Permanent methods of contraception Female tubal ligation In this method, a woman's fallopian tubes are sealed, tied, or blocked during surgery to prevent eggs from traveling to the uterus. Hysteroscopic sterilization In this method, a small, flexible insert is placed into each fallopian tube. The inserts cause scar tissue to form in the fallopian tubes and block them, so sperm cannot reach an egg. The procedure takes about 3 months to be effective. Another form of birth control must be used during those 3 months. Female sterilization This is a procedure to tie off the tubes that carry sperm (vasectomy). After the procedure, the man can still ejaculate fluid (semen). Another form of birth control must be used for 3 months after the procedure. Natural planning methods Natural family planning In this method, a couple does not have sex on days when the woman could become pregnant. Calendar method In this method, the woman keeps track of the length of each menstrual cycle, identifies the days when pregnancy can happen, and does not have sex on those days. Ovulation method In this method, a couple avoids sex during ovulation. Symptothermal method This method involves not having sex during ovulation. The woman typically checks for ovulation by watching changes in her temperature and in the consistency of cervical mucus. Post-ovulation method In this method, a couple waits to have sex until after ovulation. Where to find more information  Centers for Disease Control and Prevention: www.cdc.gov Summary  Contraception, also called birth control, refers to methods or  devices that prevent pregnancy.  Hormonal methods of contraception include implants, injections, pills, patches, vaginal rings, and emergency contraceptives.  Barrier methods of contraception can include female condoms, female condoms, diaphragms, cervical caps, sponges, and spermicides.  There are two types of IUDs (intrauterine devices). An IUD can be put in a woman's uterus to prevent pregnancy for 3-5 years.  Permanent sterilization can be done through a procedure for males and females. Natural family planning methods involve nothaving sex on days when the woman could become pregnant. This information is not intended to replace advice given to you by your health care provider. Make sure you discuss any questions you have with your health care provider. Document Revised: 06/09/2019 Document Reviewed: 06/09/2019 Elsevier Patient Education  2021 [employer] Inc.   Breastfeeding  Choosing to breastfeed is one of the best decisions you can make for yourself and your baby. A change in hormones during pregnancy causes your breasts to make breast milk in your milk-producing glands. Hormones prevent breast milk from being released before your baby is born. They also prompt milk flow after birth. Once breastfeeding has begun, thoughts of your baby, as well as his or her sucking or crying, can stimulate the release of milk from your milk-producing glands. Benefits of breastfeeding Research shows that breastfeeding offers many health benefits   for infants and mothers. It also offers a cost-free and convenient way to feed your baby. For your baby  Your first milk (colostrum) helps your baby's digestive system to function better.  Special cells in your milk (antibodies) help your baby to fight off infections.  Breastfed babies are less likely to develop asthma, allergies, obesity, or type 2 diabetes. They are also at lower risk for sudden infant death syndrome (SIDS).  Nutrients in breast milk are better  able to meet your baby's needs compared to infant formula.  Breast milk improves your baby's brain development. For you  Breastfeeding helps to create a very special bond between you and your baby.  Breastfeeding is convenient. Breast milk costs nothing and is always available at the correct temperature.  Breastfeeding helps to burn calories. It helps you to lose the weight that you gained during pregnancy.  Breastfeeding makes your uterus return faster to its size before pregnancy. It also slows bleeding (lochia) after you give birth.  Breastfeeding helps to lower your risk of developing type 2 diabetes, osteoporosis, rheumatoid arthritis, cardiovascular disease, and breast, ovarian, uterine, and endometrial cancer later in life. Breastfeeding basics Starting breastfeeding  Find a comfortable place to sit or lie down, with your neck and back well-supported.  Place a pillow or a rolled-up blanket under your baby to bring him or her to the level of your breast (if you are seated). Nursing pillows are specially designed to help support your arms and your baby while you breastfeed.  Make sure that your baby's tummy (abdomen) is facing your abdomen.  Gently massage your breast. With your fingertips, massage from the outer edges of your breast inward toward the nipple. This encourages milk flow. If your milk flows slowly, you may need to continue this action during the feeding.  Support your breast with 4 fingers underneath and your thumb above your nipple (make the letter "C" with your hand). Make sure your fingers are well away from your nipple and your baby's mouth.  Stroke your baby's lips gently with your finger or nipple.  When your baby's mouth is open wide enough, quickly bring your baby to your breast, placing your entire nipple and as much of the areola as possible into your baby's mouth. The areola is the colored area around your nipple. ? More areola should be visible above your  baby's upper lip than below the lower lip. ? Your baby's lips should be opened and extended outward (flanged) to ensure an adequate, comfortable latch. ? Your baby's tongue should be between his or her lower gum and your breast.  Make sure that your baby's mouth is correctly positioned around your nipple (latched). Your baby's lips should create a seal on your breast and be turned out (everted).  It is common for your baby to suck about 2-3 minutes in order to start the flow of breast milk. Latching Teaching your baby how to latch onto your breast properly is very important. An improper latch can cause nipple pain, decreased milk supply, and poor weight gain in your baby. Also, if your baby is not latched onto your nipple properly, he or she may swallow some air during feeding. This can make your baby fussy. Burping your baby when you switch breasts during the feeding can help to get rid of the air. However, teaching your baby to latch on properly is still the best way to prevent fussiness from swallowing air while breastfeeding. Signs that your baby has successfully latched onto   your nipple  Silent tugging or silent sucking, without causing you pain. Infant's lips should be extended outward (flanged).  Swallowing heard between every 3-4 sucks once your milk has started to flow (after your let-down milk reflex occurs).  Muscle movement above and in front of his or her ears while sucking. Signs that your baby has not successfully latched onto your nipple  Sucking sounds or smacking sounds from your baby while breastfeeding.  Nipple pain. If you think your baby has not latched on correctly, slip your finger into the corner of your baby's mouth to break the suction and place it between your baby's gums. Attempt to start breastfeeding again. Signs of successful breastfeeding Signs from your baby  Your baby will gradually decrease the number of sucks or will completely stop sucking.  Your baby  will fall asleep.  Your baby's body will relax.  Your baby will retain a small amount of milk in his or her mouth.  Your baby will let go of your breast by himself or herself. Signs from you  Breasts that have increased in firmness, weight, and size 1-3 hours after feeding.  Breasts that are softer immediately after breastfeeding.  Increased milk volume, as well as a change in milk consistency and color by the fifth day of breastfeeding.  Nipples that are not sore, cracked, or bleeding. Signs that your baby is getting enough milk  Wetting at least 1-2 diapers during the first 24 hours after birth.  Wetting at least 5-6 diapers every 24 hours for the first week after birth. The urine should be clear or pale yellow by the age of 5 days.  Wetting 6-8 diapers every 24 hours as your baby continues to grow and develop.  At least 3 stools in a 24-hour period by the age of 5 days. The stool should be soft and yellow.  At least 3 stools in a 24-hour period by the age of 7 days. The stool should be seedy and yellow.  No loss of weight greater than 10% of birth weight during the first 3 days of life.  Average weight gain of 4-7 oz (113-198 g) per week after the age of 4 days.  Consistent daily weight gain by the age of 5 days, without weight loss after the age of 2 weeks. After a feeding, your baby may spit up a small amount of milk. This is normal. Breastfeeding frequency and duration Frequent feeding will help you make more milk and can prevent sore nipples and extremely full breasts (breast engorgement). Breastfeed when you feel the need to reduce the fullness of your breasts or when your baby shows signs of hunger. This is called "breastfeeding on demand." Signs that your baby is hungry include:  Increased alertness, activity, or restlessness.  Movement of the head from side to side.  Opening of the mouth when the corner of the mouth or cheek is stroked (rooting).  Increased  sucking sounds, smacking lips, cooing, sighing, or squeaking.  Hand-to-mouth movements and sucking on fingers or hands.  Fussing or crying. Avoid introducing a pacifier to your baby in the first 4-6 weeks after your baby is born. After this time, you may choose to use a pacifier. Research has shown that pacifier use during the first year of a baby's life decreases the risk of sudden infant death syndrome (SIDS). Allow your baby to feed on each breast as long as he or she wants. When your baby unlatches or falls asleep while feeding from the   first breast, offer the second breast. Because newborns are often sleepy in the first few weeks of life, you may need to awaken your baby to get him or her to feed. Breastfeeding times will vary from baby to baby. However, the following rules can serve as a guide to help you make sure that your baby is properly fed:  Newborns (babies 4 weeks of age or younger) may breastfeed every 1-3 hours.  Newborns should not go without breastfeeding for longer than 3 hours during the day or 5 hours during the night.  You should breastfeed your baby a minimum of 8 times in a 24-hour period. Breast milk pumping Pumping and storing breast milk allows you to make sure that your baby is exclusively fed your breast milk, even at times when you are unable to breastfeed. This is especially important if you go back to work while you are still breastfeeding, or if you are not able to be present during feedings. Your lactation consultant can help you find a method of pumping that works best for you and give you guidelines about how long it is safe to store breast milk.      Caring for your breasts while you breastfeed Nipples can become dry, cracked, and sore while breastfeeding. The following recommendations can help keep your breasts moisturized and healthy:  Avoid using soap on your nipples.  Wear a supportive bra designed especially for nursing. Avoid wearing underwire-style  bras or extremely tight bras (sports bras).  Air-dry your nipples for 3-4 minutes after each feeding.  Use only cotton bra pads to absorb leaked breast milk. Leaking of breast milk between feedings is normal.  Use lanolin on your nipples after breastfeeding. Lanolin helps to maintain your skin's normal moisture barrier. Pure lanolin is not harmful (not toxic) to your baby. You may also hand express a few drops of breast milk and gently massage that milk into your nipples and allow the milk to air-dry. In the first few weeks after giving birth, some women experience breast engorgement. Engorgement can make your breasts feel heavy, warm, and tender to the touch. Engorgement peaks within 3-5 days after you give birth. The following recommendations can help to ease engorgement:  Completely empty your breasts while breastfeeding or pumping. You may want to start by applying warm, moist heat (in the shower or with warm, water-soaked hand towels) just before feeding or pumping. This increases circulation and helps the milk flow. If your baby does not completely empty your breasts while breastfeeding, pump any extra milk after he or she is finished.  Apply ice packs to your breasts immediately after breastfeeding or pumping, unless this is too uncomfortable for you. To do this: ? Put ice in a plastic bag. ? Place a towel between your skin and the bag. ? Leave the ice on for 20 minutes, 2-3 times a day.  Make sure that your baby is latched on and positioned properly while breastfeeding. If engorgement persists after 48 hours of following these recommendations, contact your health care provider or a lactation consultant. Overall health care recommendations while breastfeeding  Eat 3 healthy meals and 3 snacks every day. Well-nourished mothers who are breastfeeding need an additional 450-500 calories a day. You can meet this requirement by increasing the amount of a balanced diet that you eat.  Drink  enough water to keep your urine pale yellow or clear.  Rest often, relax, and continue to take your prenatal vitamins to prevent fatigue, stress, and low   vitamin and mineral levels in your body (nutrient deficiencies).  Do not use any products that contain nicotine or tobacco, such as cigarettes and e-cigarettes. Your baby may be harmed by chemicals from cigarettes that pass into breast milk and exposure to secondhand smoke. If you need help quitting, ask your health care provider.  Avoid alcohol.  Do not use illegal drugs or marijuana.  Talk with your health care provider before taking any medicines. These include over-the-counter and prescription medicines as well as vitamins and herbal supplements. Some medicines that may be harmful to your baby can pass through breast milk.  It is possible to become pregnant while breastfeeding. If birth control is desired, ask your health care provider about options that will be safe while breastfeeding your baby. Where to find more information: [employer]: www.llli.org Contact a health care provider if:  You feel like you want to stop breastfeeding or have become frustrated with breastfeeding.  Your nipples are cracked or bleeding.  Your breasts are red, tender, or warm.  You have: ? Painful breasts or nipples. ? A swollen area on either breast. ? A fever or chills. ? Nausea or vomiting. ? Drainage other than breast milk from your nipples.  Your breasts do not become full before feedings by the fifth day after you give birth.  You feel sad and depressed.  Your baby is: ? Too sleepy to eat well. ? Having trouble sleeping. ? More than 1 week old and wetting fewer than 6 diapers in a 24-hour period. ? Not gaining weight by 5 days of age.  Your baby has fewer than 3 stools in a 24-hour period.  Your baby's skin or the white parts of his or her eyes become yellow. Get help right away if:  Your baby is overly tired  (lethargic) and does not want to wake up and feed.  Your baby develops an unexplained fever. Summary  Breastfeeding offers many health benefits for infant and mothers.  Try to breastfeed your infant when he or she shows early signs of hunger.  Gently tickle or stroke your baby's lips with your finger or nipple to allow the baby to open his or her mouth. Bring the baby to your breast. Make sure that much of the areola is in your baby's mouth. Offer one side and burp the baby before you offer the other side.  Talk with your health care provider or lactation consultant if you have questions or you face problems as you breastfeed. This information is not intended to replace advice given to you by your health care provider. Make sure you discuss any questions you have with your health care provider. Document Revised: 03/29/2017 Document Reviewed: 02/04/2016 Elsevier Patient Education  2021 Else[employer] Inc.  

## 2020-03-12 ENCOUNTER — Encounter: Payer: Self-pay | Admitting: *Deleted

## 2020-03-15 ENCOUNTER — Telehealth: Payer: Self-pay | Admitting: Family Medicine

## 2020-03-15 DIAGNOSIS — D563 Thalassemia minor: Secondary | ICD-10-CM

## 2020-03-15 NOTE — Telephone Encounter (Signed)
Patient previously notified of results. She declined genetic screening on FOB.

## 2020-03-15 NOTE — Telephone Encounter (Signed)
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 Refer her to [facility] and offer partner screening.

## 2020-03-23 ENCOUNTER — Other Ambulatory Visit: Payer: Self-pay

## 2020-03-23 ENCOUNTER — Ambulatory Visit: Payer: Medicaid Other | Admitting: *Deleted

## 2020-03-23 ENCOUNTER — Encounter: Payer: Self-pay | Admitting: *Deleted

## 2020-03-23 ENCOUNTER — Ambulatory Visit: Payer: Medicaid Other | Attending: Obstetrics and Gynecology

## 2020-03-23 DIAGNOSIS — Z87898 Personal history of other specified conditions: Secondary | ICD-10-CM

## 2020-03-23 DIAGNOSIS — Z3A23 23 weeks gestation of pregnancy: Secondary | ICD-10-CM | POA: Insufficient documentation

## 2020-03-23 DIAGNOSIS — E669 Obesity, unspecified: Secondary | ICD-10-CM

## 2020-03-23 DIAGNOSIS — O09219 Supervision of pregnancy with history of pre-term labor, unspecified trimester: Secondary | ICD-10-CM

## 2020-03-23 DIAGNOSIS — O34219 Maternal care for unspecified type scar from previous cesarean delivery: Secondary | ICD-10-CM | POA: Diagnosis not present

## 2020-03-23 DIAGNOSIS — O99842 Bariatric surgery status complicating pregnancy, second trimester: Secondary | ICD-10-CM | POA: Diagnosis not present

## 2020-03-23 DIAGNOSIS — O09522 Supervision of elderly multigravida, second trimester: Secondary | ICD-10-CM | POA: Diagnosis not present

## 2020-03-23 DIAGNOSIS — Z98891 History of uterine scar from previous surgery: Secondary | ICD-10-CM | POA: Diagnosis not present

## 2020-03-23 DIAGNOSIS — O99212 Obesity complicating pregnancy, second trimester: Secondary | ICD-10-CM | POA: Diagnosis not present

## 2020-03-23 DIAGNOSIS — O0990 Supervision of high risk pregnancy, unspecified, unspecified trimester: Secondary | ICD-10-CM | POA: Insufficient documentation

## 2020-03-23 DIAGNOSIS — O10012 Pre-existing essential hypertension complicating pregnancy, second trimester: Secondary | ICD-10-CM | POA: Diagnosis not present

## 2020-03-23 DIAGNOSIS — Z6841 Body Mass Index (BMI) 40.0 and over, adult: Secondary | ICD-10-CM

## 2020-03-24 ENCOUNTER — Other Ambulatory Visit: Payer: Self-pay | Admitting: *Deleted

## 2020-03-24 ENCOUNTER — Ambulatory Visit (INDEPENDENT_AMBULATORY_CARE_PROVIDER_SITE_OTHER): Payer: Medicaid Other

## 2020-03-24 DIAGNOSIS — O10912 Unspecified pre-existing hypertension complicating pregnancy, second trimester: Secondary | ICD-10-CM

## 2020-03-24 DIAGNOSIS — Z23 Encounter for immunization: Secondary | ICD-10-CM

## 2020-03-25 DIAGNOSIS — Z23 Encounter for immunization: Secondary | ICD-10-CM | POA: Diagnosis not present

## 2020-03-25 NOTE — Progress Notes (Signed)
   Cov[facility] Vaccination [facility]  Name:  Danielle [name]    MRN: [id number] DOB: 04-18-1985  03/24/2020  [name] was observed post Covid-19 immunization for 15 minutes without incident. She was provided with Vaccine Information Sheet and instruction to access the V-Safe system.   [name] was instructed to call 911 with any severe reactions post vaccine: Marland Kitchen Difficulty breathing  . Swelling of face and throat  . A fast heartbeat  . A bad rash all over body  . Dizziness and weakness   Patient provided with updated immunization card.   [name], RN

## 2020-04-06 ENCOUNTER — Ambulatory Visit (INDEPENDENT_AMBULATORY_CARE_PROVIDER_SITE_OTHER): Payer: Medicaid Other | Admitting: Family Medicine

## 2020-04-06 ENCOUNTER — Encounter: Payer: Self-pay | Admitting: Family Medicine

## 2020-04-06 ENCOUNTER — Other Ambulatory Visit: Payer: Self-pay

## 2020-04-06 VITALS — BP 149/86 | HR 95 | Wt 398.2 lb

## 2020-04-06 DIAGNOSIS — O10919 Unspecified pre-existing hypertension complicating pregnancy, unspecified trimester: Secondary | ICD-10-CM | POA: Diagnosis not present

## 2020-04-06 DIAGNOSIS — D649 Anemia, unspecified: Secondary | ICD-10-CM

## 2020-04-06 DIAGNOSIS — Z98891 History of uterine scar from previous surgery: Secondary | ICD-10-CM

## 2020-04-06 DIAGNOSIS — Z6791 Unspecified blood type, Rh negative: Secondary | ICD-10-CM

## 2020-04-06 DIAGNOSIS — O09522 Supervision of elderly multigravida, second trimester: Secondary | ICD-10-CM

## 2020-04-06 DIAGNOSIS — O26899 Other specified pregnancy related conditions, unspecified trimester: Secondary | ICD-10-CM

## 2020-04-06 DIAGNOSIS — Z8751 Personal history of pre-term labor: Secondary | ICD-10-CM

## 2020-04-06 DIAGNOSIS — Z87898 Personal history of other specified conditions: Secondary | ICD-10-CM

## 2020-04-06 DIAGNOSIS — O0990 Supervision of high risk pregnancy, unspecified, unspecified trimester: Secondary | ICD-10-CM

## 2020-04-06 NOTE — Patient Instructions (Signed)
 Contraception Choices Contraception, also called birth control, refers to methods or devices that prevent pregnancy. Hormonal methods Contraceptive implant A contraceptive implant is a thin, plastic tube that contains a hormone that prevents pregnancy. It is different from an intrauterine device (IUD). It is inserted into the upper part of the arm by a health care provider. Implants can be effective for up to 3 years. Progestin-only injections Progestin-only injections are injections of progestin, a synthetic form of the hormone progesterone. They are given every 3 months by a health care provider. Birth control pills Birth control pills are pills that contain hormones that prevent pregnancy. They must be taken once a day, preferably at the same time each day. A prescription is needed to use this method of contraception. Birth control patch The birth control patch contains hormones that prevent pregnancy. It is placed on the skin and must be changed once a week for three weeks and removed on the fourth week. A prescription is needed to use this method of contraception. Vaginal ring A vaginal ring contains hormones that prevent pregnancy. It is placed in the vagina for three weeks and removed on the fourth week. After that, the process is repeated with a new ring. A prescription is needed to use this method of contraception. Emergency contraceptive Emergency contraceptives prevent pregnancy after unprotected sex. They come in pill form and can be taken up to 5 days after sex. They work best the sooner they are taken after having sex. Most emergency contraceptives are available without a prescription. This method should not be used as your only form of birth control.   Barrier methods Female condom A female condom is a thin sheath that is worn over the penis during sex. Condoms keep sperm from going inside a woman's body. They can be used with a sperm-killing substance (spermicide) to increase their  effectiveness. They should be thrown away after one use. Female condom A female condom is a soft, loose-fitting sheath that is put into the vagina before sex. The condom keeps sperm from going inside a woman's body. They should be thrown away after one use. Diaphragm A diaphragm is a soft, dome-shaped barrier. It is inserted into the vagina before sex, along with a spermicide. The diaphragm blocks sperm from entering the uterus, and the spermicide kills sperm. A diaphragm should be left in the vagina for 6-8 hours after sex and removed within 24 hours. A diaphragm is prescribed and fitted by a health care provider. A diaphragm should be replaced every 1-2 years, after giving birth, after gaining more than 15 lb (6.8 kg), and after pelvic surgery. Cervical cap A cervical cap is a round, soft latex or plastic cup that fits over the cervix. It is inserted into the vagina before sex, along with spermicide. It blocks sperm from entering the uterus. The cap should be left in place for 6-8 hours after sex and removed within 48 hours. A cervical cap must be prescribed and fitted by a health care provider. It should be replaced every 2 years. Sponge A sponge is a soft, circular piece of polyurethane foam with spermicide in it. The sponge helps block sperm from entering the uterus, and the spermicide kills sperm. To use it, you make it wet and then insert it into the vagina. It should be inserted before sex, left in for at least 6 hours after sex, and removed and thrown away within 30 hours. Spermicides Spermicides are chemicals that kill or block sperm from entering the   cervix and uterus. They can come as a cream, jelly, suppository, foam, or tablet. A spermicide should be inserted into the vagina with an applicator at least 10-15 minutes before sex to allow time for it to work. The process must be repeated every time you have sex. Spermicides do not require a prescription.   Intrauterine  contraception Intrauterine device (IUD) An IUD is a T-shaped device that is put in a woman's uterus. There are two types:  Hormone IUD.This type contains progestin, a synthetic form of the hormone progesterone. This type can stay in place for 3-5 years.  Copper IUD.This type is wrapped in copper wire. It can stay in place for 10 years. Permanent methods of contraception Female tubal ligation In this method, a woman's fallopian tubes are sealed, tied, or blocked during surgery to prevent eggs from traveling to the uterus. Hysteroscopic sterilization In this method, a small, flexible insert is placed into each fallopian tube. The inserts cause scar tissue to form in the fallopian tubes and block them, so sperm cannot reach an egg. The procedure takes about 3 months to be effective. Another form of birth control must be used during those 3 months. Female sterilization This is a procedure to tie off the tubes that carry sperm (vasectomy). After the procedure, the man can still ejaculate fluid (semen). Another form of birth control must be used for 3 months after the procedure. Natural planning methods Natural family planning In this method, a couple does not have sex on days when the woman could become pregnant. Calendar method In this method, the woman keeps track of the length of each menstrual cycle, identifies the days when pregnancy can happen, and does not have sex on those days. Ovulation method In this method, a couple avoids sex during ovulation. Symptothermal method This method involves not having sex during ovulation. The woman typically checks for ovulation by watching changes in her temperature and in the consistency of cervical mucus. Post-ovulation method In this method, a couple waits to have sex until after ovulation. Where to find more information  Centers for Disease Control and Prevention: www.cdc.gov Summary  Contraception, also called birth control, refers to methods or  devices that prevent pregnancy.  Hormonal methods of contraception include implants, injections, pills, patches, vaginal rings, and emergency contraceptives.  Barrier methods of contraception can include female condoms, female condoms, diaphragms, cervical caps, sponges, and spermicides.  There are two types of IUDs (intrauterine devices). An IUD can be put in a woman's uterus to prevent pregnancy for 3-5 years.  Permanent sterilization can be done through a procedure for males and females. Natural family planning methods involve nothaving sex on days when the woman could become pregnant. This information is not intended to replace advice given to you by your health care provider. Make sure you discuss any questions you have with your health care provider. Document Revised: 06/09/2019 Document Reviewed: 06/09/2019 Elsevier Patient Education  2021 [employer] Inc.   Breastfeeding  Choosing to breastfeed is one of the best decisions you can make for yourself and your baby. A change in hormones during pregnancy causes your breasts to make breast milk in your milk-producing glands. Hormones prevent breast milk from being released before your baby is born. They also prompt milk flow after birth. Once breastfeeding has begun, thoughts of your baby, as well as his or her sucking or crying, can stimulate the release of milk from your milk-producing glands. Benefits of breastfeeding Research shows that breastfeeding offers many health benefits   for infants and mothers. It also offers a cost-free and convenient way to feed your baby. For your baby  Your first milk (colostrum) helps your baby's digestive system to function better.  Special cells in your milk (antibodies) help your baby to fight off infections.  Breastfed babies are less likely to develop asthma, allergies, obesity, or type 2 diabetes. They are also at lower risk for sudden infant death syndrome (SIDS).  Nutrients in breast milk are better  able to meet your baby's needs compared to infant formula.  Breast milk improves your baby's brain development. For you  Breastfeeding helps to create a very special bond between you and your baby.  Breastfeeding is convenient. Breast milk costs nothing and is always available at the correct temperature.  Breastfeeding helps to burn calories. It helps you to lose the weight that you gained during pregnancy.  Breastfeeding makes your uterus return faster to its size before pregnancy. It also slows bleeding (lochia) after you give birth.  Breastfeeding helps to lower your risk of developing type 2 diabetes, osteoporosis, rheumatoid arthritis, cardiovascular disease, and breast, ovarian, uterine, and endometrial cancer later in life. Breastfeeding basics Starting breastfeeding  Find a comfortable place to sit or lie down, with your neck and back well-supported.  Place a pillow or a rolled-up blanket under your baby to bring him or her to the level of your breast (if you are seated). Nursing pillows are specially designed to help support your arms and your baby while you breastfeed.  Make sure that your baby's tummy (abdomen) is facing your abdomen.  Gently massage your breast. With your fingertips, massage from the outer edges of your breast inward toward the nipple. This encourages milk flow. If your milk flows slowly, you may need to continue this action during the feeding.  Support your breast with 4 fingers underneath and your thumb above your nipple (make the letter "C" with your hand). Make sure your fingers are well away from your nipple and your baby's mouth.  Stroke your baby's lips gently with your finger or nipple.  When your baby's mouth is open wide enough, quickly bring your baby to your breast, placing your entire nipple and as much of the areola as possible into your baby's mouth. The areola is the colored area around your nipple. ? More areola should be visible above your  baby's upper lip than below the lower lip. ? Your baby's lips should be opened and extended outward (flanged) to ensure an adequate, comfortable latch. ? Your baby's tongue should be between his or her lower gum and your breast.  Make sure that your baby's mouth is correctly positioned around your nipple (latched). Your baby's lips should create a seal on your breast and be turned out (everted).  It is common for your baby to suck about 2-3 minutes in order to start the flow of breast milk. Latching Teaching your baby how to latch onto your breast properly is very important. An improper latch can cause nipple pain, decreased milk supply, and poor weight gain in your baby. Also, if your baby is not latched onto your nipple properly, he or she may swallow some air during feeding. This can make your baby fussy. Burping your baby when you switch breasts during the feeding can help to get rid of the air. However, teaching your baby to latch on properly is still the best way to prevent fussiness from swallowing air while breastfeeding. Signs that your baby has successfully latched onto   your nipple  Silent tugging or silent sucking, without causing you pain. Infant's lips should be extended outward (flanged).  Swallowing heard between every 3-4 sucks once your milk has started to flow (after your let-down milk reflex occurs).  Muscle movement above and in front of his or her ears while sucking. Signs that your baby has not successfully latched onto your nipple  Sucking sounds or smacking sounds from your baby while breastfeeding.  Nipple pain. If you think your baby has not latched on correctly, slip your finger into the corner of your baby's mouth to break the suction and place it between your baby's gums. Attempt to start breastfeeding again. Signs of successful breastfeeding Signs from your baby  Your baby will gradually decrease the number of sucks or will completely stop sucking.  Your baby  will fall asleep.  Your baby's body will relax.  Your baby will retain a small amount of milk in his or her mouth.  Your baby will let go of your breast by himself or herself. Signs from you  Breasts that have increased in firmness, weight, and size 1-3 hours after feeding.  Breasts that are softer immediately after breastfeeding.  Increased milk volume, as well as a change in milk consistency and color by the fifth day of breastfeeding.  Nipples that are not sore, cracked, or bleeding. Signs that your baby is getting enough milk  Wetting at least 1-2 diapers during the first 24 hours after birth.  Wetting at least 5-6 diapers every 24 hours for the first week after birth. The urine should be clear or pale yellow by the age of 5 days.  Wetting 6-8 diapers every 24 hours as your baby continues to grow and develop.  At least 3 stools in a 24-hour period by the age of 5 days. The stool should be soft and yellow.  At least 3 stools in a 24-hour period by the age of 7 days. The stool should be seedy and yellow.  No loss of weight greater than 10% of birth weight during the first 3 days of life.  Average weight gain of 4-7 oz (113-198 g) per week after the age of 4 days.  Consistent daily weight gain by the age of 5 days, without weight loss after the age of 2 weeks. After a feeding, your baby may spit up a small amount of milk. This is normal. Breastfeeding frequency and duration Frequent feeding will help you make more milk and can prevent sore nipples and extremely full breasts (breast engorgement). Breastfeed when you feel the need to reduce the fullness of your breasts or when your baby shows signs of hunger. This is called "breastfeeding on demand." Signs that your baby is hungry include:  Increased alertness, activity, or restlessness.  Movement of the head from side to side.  Opening of the mouth when the corner of the mouth or cheek is stroked (rooting).  Increased  sucking sounds, smacking lips, cooing, sighing, or squeaking.  Hand-to-mouth movements and sucking on fingers or hands.  Fussing or crying. Avoid introducing a pacifier to your baby in the first 4-6 weeks after your baby is born. After this time, you may choose to use a pacifier. Research has shown that pacifier use during the first year of a baby's life decreases the risk of sudden infant death syndrome (SIDS). Allow your baby to feed on each breast as long as he or she wants. When your baby unlatches or falls asleep while feeding from the   first breast, offer the second breast. Because newborns are often sleepy in the first few weeks of life, you may need to awaken your baby to get him or her to feed. Breastfeeding times will vary from baby to baby. However, the following rules can serve as a guide to help you make sure that your baby is properly fed:  Newborns (babies 4 weeks of age or younger) may breastfeed every 1-3 hours.  Newborns should not go without breastfeeding for longer than 3 hours during the day or 5 hours during the night.  You should breastfeed your baby a minimum of 8 times in a 24-hour period. Breast milk pumping Pumping and storing breast milk allows you to make sure that your baby is exclusively fed your breast milk, even at times when you are unable to breastfeed. This is especially important if you go back to work while you are still breastfeeding, or if you are not able to be present during feedings. Your lactation consultant can help you find a method of pumping that works best for you and give you guidelines about how long it is safe to store breast milk.      Caring for your breasts while you breastfeed Nipples can become dry, cracked, and sore while breastfeeding. The following recommendations can help keep your breasts moisturized and healthy:  Avoid using soap on your nipples.  Wear a supportive bra designed especially for nursing. Avoid wearing underwire-style  bras or extremely tight bras (sports bras).  Air-dry your nipples for 3-4 minutes after each feeding.  Use only cotton bra pads to absorb leaked breast milk. Leaking of breast milk between feedings is normal.  Use lanolin on your nipples after breastfeeding. Lanolin helps to maintain your skin's normal moisture barrier. Pure lanolin is not harmful (not toxic) to your baby. You may also hand express a few drops of breast milk and gently massage that milk into your nipples and allow the milk to air-dry. In the first few weeks after giving birth, some women experience breast engorgement. Engorgement can make your breasts feel heavy, warm, and tender to the touch. Engorgement peaks within 3-5 days after you give birth. The following recommendations can help to ease engorgement:  Completely empty your breasts while breastfeeding or pumping. You may want to start by applying warm, moist heat (in the shower or with warm, water-soaked hand towels) just before feeding or pumping. This increases circulation and helps the milk flow. If your baby does not completely empty your breasts while breastfeeding, pump any extra milk after he or she is finished.  Apply ice packs to your breasts immediately after breastfeeding or pumping, unless this is too uncomfortable for you. To do this: ? Put ice in a plastic bag. ? Place a towel between your skin and the bag. ? Leave the ice on for 20 minutes, 2-3 times a day.  Make sure that your baby is latched on and positioned properly while breastfeeding. If engorgement persists after 48 hours of following these recommendations, contact your health care provider or a lactation consultant. Overall health care recommendations while breastfeeding  Eat 3 healthy meals and 3 snacks every day. Well-nourished mothers who are breastfeeding need an additional 450-500 calories a day. You can meet this requirement by increasing the amount of a balanced diet that you eat.  Drink  enough water to keep your urine pale yellow or clear.  Rest often, relax, and continue to take your prenatal vitamins to prevent fatigue, stress, and low   vitamin and mineral levels in your body (nutrient deficiencies).  Do not use any products that contain nicotine or tobacco, such as cigarettes and e-cigarettes. Your baby may be harmed by chemicals from cigarettes that pass into breast milk and exposure to secondhand smoke. If you need help quitting, ask your health care provider.  Avoid alcohol.  Do not use illegal drugs or marijuana.  Talk with your health care provider before taking any medicines. These include over-the-counter and prescription medicines as well as vitamins and herbal supplements. Some medicines that may be harmful to your baby can pass through breast milk.  It is possible to become pregnant while breastfeeding. If birth control is desired, ask your health care provider about options that will be safe while breastfeeding your baby. Where to find more information: [employer]: www.llli.org Contact a health care provider if:  You feel like you want to stop breastfeeding or have become frustrated with breastfeeding.  Your nipples are cracked or bleeding.  Your breasts are red, tender, or warm.  You have: ? Painful breasts or nipples. ? A swollen area on either breast. ? A fever or chills. ? Nausea or vomiting. ? Drainage other than breast milk from your nipples.  Your breasts do not become full before feedings by the fifth day after you give birth.  You feel sad and depressed.  Your baby is: ? Too sleepy to eat well. ? Having trouble sleeping. ? More than 1 week old and wetting fewer than 6 diapers in a 24-hour period. ? Not gaining weight by 5 days of age.  Your baby has fewer than 3 stools in a 24-hour period.  Your baby's skin or the white parts of his or her eyes become yellow. Get help right away if:  Your baby is overly tired  (lethargic) and does not want to wake up and feed.  Your baby develops an unexplained fever. Summary  Breastfeeding offers many health benefits for infant and mothers.  Try to breastfeed your infant when he or she shows early signs of hunger.  Gently tickle or stroke your baby's lips with your finger or nipple to allow the baby to open his or her mouth. Bring the baby to your breast. Make sure that much of the areola is in your baby's mouth. Offer one side and burp the baby before you offer the other side.  Talk with your health care provider or lactation consultant if you have questions or you face problems as you breastfeed. This information is not intended to replace advice given to you by your health care provider. Make sure you discuss any questions you have with your health care provider. Document Revised: 03/29/2017 Document Reviewed: 02/04/2016 Elsevier Patient Education  2021 Else[employer] Inc.  

## 2020-04-06 NOTE — Progress Notes (Signed)
   Subjective:  Danielle [name] is a 35 y.o. [id number] at [redacted]w[redacted]d being seen today for ongoing prenatal care.  She is currently monitored for the following issues for this high-risk pregnancy and has Rh negative state in antepartum period; Anemia; Hirsutism; Supervision of high risk pregnancy, antepartum; Advanced maternal age in multigravida; History of 2 cesarean sections; History of poor fetal growth; History of preterm delivery; Alpha-thalassemia (HCC); COVID-19 vaccine administered; Chronic hypertension during pregnancy; and Alpha thalassemia silent carrier on their problem list.  Patient reports fatigue.  Contractions: Not present. Vag. Bleeding: None.  Movement: Present. Denies leaking of fluid.   The following portions of the patient's history were reviewed and updated as appropriate: allergies, current medications, past family history, past medical history, past social history, past surgical history and problem list. Problem list updated.  Objective:   Vitals:   04/06/20 1013  BP: (!) 149/86  Pulse: 95  Weight: (!) 398 lb 3.2 oz (180.6 kg)    Fetal Status: Fetal Heart Rate (bpm): 147   Movement: Present     General:  Alert, oriented and cooperative. Patient is in no acute distress.  Skin: Skin is warm and dry. No rash noted.   Cardiovascular: Normal heart rate noted  Respiratory: Normal respiratory effort, no problems with respiration noted  Abdomen: Soft, gravid, appropriate for gestational age. Pain/Pressure: Present     Pelvic: Vag. Bleeding: None     Cervical exam deferred        Extremities: Normal range of motion.  Edema: None  Mental Status: Normal mood and affect. Normal behavior. Normal judgment and thought content.   Urinalysis:      Assessment and Plan:  Pregnancy: [id number] at [redacted]w[redacted]d  1. Supervision of high risk pregnancy, antepartum BP mild range, FHR normal Third tri labs and TDaP next visit Last growth [place] normal 3/8, has f/u scheduled w MFM  2. Rh negative  state in antepartum period Rhogam next visit  3. History of preterm delivery PPROM and breech at 36 weeks with first delivery  4. History of 2 cesarean sections For repeat and BTL, sign TOLAC and BTL forms at next visit Husband also planning to get a vasectomy  5. Chronic hypertension during pregnancy BP mild range today No baseline labs on file, collected today  6. Multigravida of advanced maternal age in second trimester   7. Anemia, unspecified type Last hgb 10.6, checking CBC today  8. History of poor fetal growth   Preterm labor symptoms and general obstetric precautions including but not limited to vaginal bleeding, contractions, leaking of fluid and fetal movement were reviewed in detail with the patient. Please refer to After Visit Summary for other counseling recommendations.  Return in 2 weeks (on 04/20/2020) for [facility], ob visit, needs MD.   Venora Maples, MD

## 2020-04-07 LAB — COMPREHENSIVE METABOLIC PANEL
ALT: 11 IU/L (ref 0–32)
AST: 13 IU/L (ref 0–40)
Albumin/Globulin Ratio: 1.3 (ref 1.2–2.2)
Albumin: 3.6 g/dL — ABNORMAL LOW (ref 3.8–4.8)
Alkaline Phosphatase: 53 IU/L (ref 44–121)
BUN/Creatinine Ratio: 14 (ref 9–23)
BUN: 6 mg/dL (ref 6–20)
Bilirubin Total: <0.2 mg/dL (ref 0.0–1.2)
CO2: 20 mmol/L (ref 20–29)
Calcium: 9.2 mg/dL (ref 8.7–10.2)
Chloride: 100 mmol/L (ref 96–106)
Creatinine, Ser: 0.44 mg/dL — ABNORMAL LOW (ref 0.57–1.00)
Globulin, Total: 2.8 g/dL (ref 1.5–4.5)
Glucose: 78 mg/dL (ref 65–99)
Potassium: 4.2 mmol/L (ref 3.5–5.2)
Sodium: 136 mmol/L (ref 134–144)
Total Protein: 6.4 g/dL (ref 6.0–8.5)
eGFR: 129 mL/min/{1.73_m2} (ref >59)

## 2020-04-07 LAB — PROTEIN / CREATININE RATIO, URINE
Creatinine, Urine: 165.8 mg/dL
Protein, Ur: 31.6 mg/dL
Protein/Creat Ratio: 191 mg/g creat (ref 0–200)

## 2020-04-07 LAB — CBC
Hematocrit: 29.3 % — ABNORMAL LOW (ref 34.0–46.6)
Hemoglobin: 8.9 g/dL — ABNORMAL LOW (ref 11.1–15.9)
MCH: 23.1 pg — ABNORMAL LOW (ref 26.6–33.0)
MCHC: 30.4 g/dL — ABNORMAL LOW (ref 31.5–35.7)
MCV: 76 fL — ABNORMAL LOW (ref 79–97)
Platelets: 286 10*3/uL (ref 150–450)
RBC: 3.86 x10E6/uL (ref 3.77–5.28)
RDW: 13.5 % (ref 11.7–15.4)
WBC: 9.5 10*3/uL (ref 3.4–10.8)

## 2020-04-08 ENCOUNTER — Telehealth: Payer: Self-pay

## 2020-04-08 NOTE — Telephone Encounter (Addendum)
-----   Message from Venora Maples, MD sent at 04/08/2020  8:29 AM EDT ----- Baseline PreE labs unremarkable w exception of hgb 8.9, patient notified by telephone.  I counseled patient on risks and benefits of IV iron and she would like to proceed. Clinical staff please call to schedule, she reports she can only go on Tuesdays, Wednesdays, and Thursdays. Orders have been placed for Venofer.   Called Short Stay to schedule appt for Venofer 500 mg. Call sent to VM, will attempt call another time.

## 2020-04-08 NOTE — Addendum Note (Signed)
Addended by: Merian Capron on: 04/08/2020 08:29 AM   Modules accepted: Orders, SmartSet

## 2020-04-09 NOTE — Telephone Encounter (Signed)
Called Short Stay. Venofer infusion appt scheduled for 04/22/20 at 0900. Pt notified.

## 2020-04-12 ENCOUNTER — Encounter: Payer: Self-pay | Admitting: *Deleted

## 2020-04-19 ENCOUNTER — Other Ambulatory Visit: Payer: Self-pay | Admitting: Lactation Services

## 2020-04-19 ENCOUNTER — Ambulatory Visit (HOSPITAL_COMMUNITY)
Admission: RE | Admit: 2020-04-19 | Discharge: 2020-04-19 | Disposition: A | Discharge status: Home / Self Care | Payer: Medicaid Other | Source: Ambulatory Visit | Attending: Family Medicine | Admitting: Family Medicine

## 2020-04-19 ENCOUNTER — Other Ambulatory Visit: Payer: Self-pay

## 2020-04-19 DIAGNOSIS — O0990 Supervision of high risk pregnancy, unspecified, unspecified trimester: Secondary | ICD-10-CM

## 2020-04-19 DIAGNOSIS — D649 Anemia, unspecified: Secondary | ICD-10-CM | POA: Insufficient documentation

## 2020-04-19 DIAGNOSIS — Z3A00 Weeks of gestation of pregnancy not specified: Secondary | ICD-10-CM | POA: Insufficient documentation

## 2020-04-19 DIAGNOSIS — O99019 Anemia complicating pregnancy, unspecified trimester: Secondary | ICD-10-CM | POA: Diagnosis not present

## 2020-04-19 LAB — FERRITIN: Ferritin: 50 ng/mL (ref 11–307)

## 2020-04-19 LAB — IRON: Iron: 74 ug/dL (ref 28–170)

## 2020-04-19 MED ORDER — SODIUM CHLORIDE 0.9 % IV SOLN: INTRAVENOUS | Status: DC | PRN

## 2020-04-19 MED ORDER — EPINEPHRINE PF 1 MG/ML IJ SOLN: 0.3000 mg | Freq: Once | INTRAMUSCULAR | Status: DC | PRN

## 2020-04-19 MED ORDER — SODIUM CHLORIDE 0.9 % IV SOLN
500.0000 mg | Freq: Once | INTRAVENOUS | Status: AC
  Administered 2020-04-19: 500 mg via INTRAVENOUS
  Filled 2020-04-19: qty 25

## 2020-04-19 MED ORDER — METHYLPREDNISOLONE SODIUM SUCC 125 MG IJ SOLR: 125.0000 mg | Freq: Once | INTRAMUSCULAR | Status: DC | PRN

## 2020-04-19 MED ORDER — ALBUTEROL SULFATE (2.5 MG/3ML) 0.083% IN NEBU: 2.5000 mg | INHALATION_SOLUTION | Freq: Once | RESPIRATORY_TRACT | Status: DC | PRN

## 2020-04-19 MED ORDER — DIPHENHYDRAMINE HCL 50 MG/ML IJ SOLN: 25.0000 mg | Freq: Once | INTRAMUSCULAR | Status: DC | PRN

## 2020-04-19 MED ORDER — SODIUM CHLORIDE 0.9 % IV BOLUS: 500.0000 mL | Freq: Once | INTRAVENOUS | Status: DC | PRN

## 2020-04-20 ENCOUNTER — Encounter: Payer: Self-pay | Admitting: *Deleted

## 2020-04-20 ENCOUNTER — Encounter: Payer: Self-pay | Admitting: Family Medicine

## 2020-04-20 ENCOUNTER — Other Ambulatory Visit: Payer: Self-pay | Admitting: *Deleted

## 2020-04-20 ENCOUNTER — Ambulatory Visit (INDEPENDENT_AMBULATORY_CARE_PROVIDER_SITE_OTHER): Payer: Medicaid Other | Admitting: Family Medicine

## 2020-04-20 ENCOUNTER — Ambulatory Visit: Payer: Medicaid Other | Admitting: *Deleted

## 2020-04-20 ENCOUNTER — Other Ambulatory Visit: Payer: Medicaid Other

## 2020-04-20 ENCOUNTER — Ambulatory Visit: Payer: Medicaid Other | Attending: Obstetrics

## 2020-04-20 VITALS — BP 119/57 | HR 89 | Wt 396.8 lb

## 2020-04-20 DIAGNOSIS — O0990 Supervision of high risk pregnancy, unspecified, unspecified trimester: Secondary | ICD-10-CM | POA: Insufficient documentation

## 2020-04-20 DIAGNOSIS — Z98891 History of uterine scar from previous surgery: Secondary | ICD-10-CM | POA: Diagnosis present

## 2020-04-20 DIAGNOSIS — Z6791 Unspecified blood type, Rh negative: Secondary | ICD-10-CM

## 2020-04-20 DIAGNOSIS — O09212 Supervision of pregnancy with history of pre-term labor, second trimester: Secondary | ICD-10-CM | POA: Diagnosis not present

## 2020-04-20 DIAGNOSIS — O99842 Bariatric surgery status complicating pregnancy, second trimester: Secondary | ICD-10-CM | POA: Diagnosis not present

## 2020-04-20 DIAGNOSIS — O34219 Maternal care for unspecified type scar from previous cesarean delivery: Secondary | ICD-10-CM | POA: Diagnosis not present

## 2020-04-20 DIAGNOSIS — O26899 Other specified pregnancy related conditions, unspecified trimester: Secondary | ICD-10-CM | POA: Diagnosis not present

## 2020-04-20 DIAGNOSIS — D649 Anemia, unspecified: Secondary | ICD-10-CM

## 2020-04-20 DIAGNOSIS — Z23 Encounter for immunization: Secondary | ICD-10-CM | POA: Diagnosis not present

## 2020-04-20 DIAGNOSIS — O99212 Obesity complicating pregnancy, second trimester: Secondary | ICD-10-CM

## 2020-04-20 DIAGNOSIS — Z3A27 27 weeks gestation of pregnancy: Secondary | ICD-10-CM

## 2020-04-20 DIAGNOSIS — E669 Obesity, unspecified: Secondary | ICD-10-CM | POA: Diagnosis not present

## 2020-04-20 DIAGNOSIS — O09522 Supervision of elderly multigravida, second trimester: Secondary | ICD-10-CM

## 2020-04-20 DIAGNOSIS — Z87898 Personal history of other specified conditions: Secondary | ICD-10-CM | POA: Insufficient documentation

## 2020-04-20 DIAGNOSIS — Z8751 Personal history of pre-term labor: Secondary | ICD-10-CM

## 2020-04-20 DIAGNOSIS — O10912 Unspecified pre-existing hypertension complicating pregnancy, second trimester: Secondary | ICD-10-CM | POA: Diagnosis present

## 2020-04-20 DIAGNOSIS — O09523 Supervision of elderly multigravida, third trimester: Secondary | ICD-10-CM

## 2020-04-20 DIAGNOSIS — R638 Other symptoms and signs concerning food and fluid intake: Secondary | ICD-10-CM

## 2020-04-20 DIAGNOSIS — O10919 Unspecified pre-existing hypertension complicating pregnancy, unspecified trimester: Secondary | ICD-10-CM

## 2020-04-20 MED ORDER — RHO D IMMUNE GLOBULIN 1500 UNIT/2ML IJ SOSY
300.0000 ug | PREFILLED_SYRINGE | Freq: Once | INTRAMUSCULAR | Status: AC
  Administered 2020-04-20: 300 ug via INTRAMUSCULAR

## 2020-04-20 NOTE — Patient Instructions (Signed)
 Contraception Choices Contraception, also called birth control, refers to methods or devices that prevent pregnancy. Hormonal methods Contraceptive implant A contraceptive implant is a thin, plastic tube that contains a hormone that prevents pregnancy. It is different from an intrauterine device (IUD). It is inserted into the upper part of the arm by a health care provider. Implants can be effective for up to 3 years. Progestin-only injections Progestin-only injections are injections of progestin, a synthetic form of the hormone progesterone. They are given every 3 months by a health care provider. Birth control pills Birth control pills are pills that contain hormones that prevent pregnancy. They must be taken once a day, preferably at the same time each day. A prescription is needed to use this method of contraception. Birth control patch The birth control patch contains hormones that prevent pregnancy. It is placed on the skin and must be changed once a week for three weeks and removed on the fourth week. A prescription is needed to use this method of contraception. Vaginal ring A vaginal ring contains hormones that prevent pregnancy. It is placed in the vagina for three weeks and removed on the fourth week. After that, the process is repeated with a new ring. A prescription is needed to use this method of contraception. Emergency contraceptive Emergency contraceptives prevent pregnancy after unprotected sex. They come in pill form and can be taken up to 5 days after sex. They work best the sooner they are taken after having sex. Most emergency contraceptives are available without a prescription. This method should not be used as your only form of birth control.   Barrier methods Female condom A female condom is a thin sheath that is worn over the penis during sex. Condoms keep sperm from going inside a woman's body. They can be used with a sperm-killing substance (spermicide) to increase their  effectiveness. They should be thrown away after one use. Female condom A female condom is a soft, loose-fitting sheath that is put into the vagina before sex. The condom keeps sperm from going inside a woman's body. They should be thrown away after one use. Diaphragm A diaphragm is a soft, dome-shaped barrier. It is inserted into the vagina before sex, along with a spermicide. The diaphragm blocks sperm from entering the uterus, and the spermicide kills sperm. A diaphragm should be left in the vagina for 6-8 hours after sex and removed within 24 hours. A diaphragm is prescribed and fitted by a health care provider. A diaphragm should be replaced every 1-2 years, after giving birth, after gaining more than 15 lb (6.8 kg), and after pelvic surgery. Cervical cap A cervical cap is a round, soft latex or plastic cup that fits over the cervix. It is inserted into the vagina before sex, along with spermicide. It blocks sperm from entering the uterus. The cap should be left in place for 6-8 hours after sex and removed within 48 hours. A cervical cap must be prescribed and fitted by a health care provider. It should be replaced every 2 years. Sponge A sponge is a soft, circular piece of polyurethane foam with spermicide in it. The sponge helps block sperm from entering the uterus, and the spermicide kills sperm. To use it, you make it wet and then insert it into the vagina. It should be inserted before sex, left in for at least 6 hours after sex, and removed and thrown away within 30 hours. Spermicides Spermicides are chemicals that kill or block sperm from entering the   cervix and uterus. They can come as a cream, jelly, suppository, foam, or tablet. A spermicide should be inserted into the vagina with an applicator at least 10-15 minutes before sex to allow time for it to work. The process must be repeated every time you have sex. Spermicides do not require a prescription.   Intrauterine  contraception Intrauterine device (IUD) An IUD is a T-shaped device that is put in a woman's uterus. There are two types:  Hormone IUD.This type contains progestin, a synthetic form of the hormone progesterone. This type can stay in place for 3-5 years.  Copper IUD.This type is wrapped in copper wire. It can stay in place for 10 years. Permanent methods of contraception Female tubal ligation In this method, a woman's fallopian tubes are sealed, tied, or blocked during surgery to prevent eggs from traveling to the uterus. Hysteroscopic sterilization In this method, a small, flexible insert is placed into each fallopian tube. The inserts cause scar tissue to form in the fallopian tubes and block them, so sperm cannot reach an egg. The procedure takes about 3 months to be effective. Another form of birth control must be used during those 3 months. Female sterilization This is a procedure to tie off the tubes that carry sperm (vasectomy). After the procedure, the man can still ejaculate fluid (semen). Another form of birth control must be used for 3 months after the procedure. Natural planning methods Natural family planning In this method, a couple does not have sex on days when the woman could become pregnant. Calendar method In this method, the woman keeps track of the length of each menstrual cycle, identifies the days when pregnancy can happen, and does not have sex on those days. Ovulation method In this method, a couple avoids sex during ovulation. Symptothermal method This method involves not having sex during ovulation. The woman typically checks for ovulation by watching changes in her temperature and in the consistency of cervical mucus. Post-ovulation method In this method, a couple waits to have sex until after ovulation. Where to find more information  Centers for Disease Control and Prevention: www.cdc.gov Summary  Contraception, also called birth control, refers to methods or  devices that prevent pregnancy.  Hormonal methods of contraception include implants, injections, pills, patches, vaginal rings, and emergency contraceptives.  Barrier methods of contraception can include female condoms, female condoms, diaphragms, cervical caps, sponges, and spermicides.  There are two types of IUDs (intrauterine devices). An IUD can be put in a woman's uterus to prevent pregnancy for 3-5 years.  Permanent sterilization can be done through a procedure for males and females. Natural family planning methods involve nothaving sex on days when the woman could become pregnant. This information is not intended to replace advice given to you by your health care provider. Make sure you discuss any questions you have with your health care provider. Document Revised: 06/09/2019 Document Reviewed: 06/09/2019 Elsevier Patient Education  2021 [employer] Inc.   Breastfeeding  Choosing to breastfeed is one of the best decisions you can make for yourself and your baby. A change in hormones during pregnancy causes your breasts to make breast milk in your milk-producing glands. Hormones prevent breast milk from being released before your baby is born. They also prompt milk flow after birth. Once breastfeeding has begun, thoughts of your baby, as well as his or her sucking or crying, can stimulate the release of milk from your milk-producing glands. Benefits of breastfeeding Research shows that breastfeeding offers many health benefits   for infants and mothers. It also offers a cost-free and convenient way to feed your baby. For your baby  Your first milk (colostrum) helps your baby's digestive system to function better.  Special cells in your milk (antibodies) help your baby to fight off infections.  Breastfed babies are less likely to develop asthma, allergies, obesity, or type 2 diabetes. They are also at lower risk for sudden infant death syndrome (SIDS).  Nutrients in breast milk are better  able to meet your baby's needs compared to infant formula.  Breast milk improves your baby's brain development. For you  Breastfeeding helps to create a very special bond between you and your baby.  Breastfeeding is convenient. Breast milk costs nothing and is always available at the correct temperature.  Breastfeeding helps to burn calories. It helps you to lose the weight that you gained during pregnancy.  Breastfeeding makes your uterus return faster to its size before pregnancy. It also slows bleeding (lochia) after you give birth.  Breastfeeding helps to lower your risk of developing type 2 diabetes, osteoporosis, rheumatoid arthritis, cardiovascular disease, and breast, ovarian, uterine, and endometrial cancer later in life. Breastfeeding basics Starting breastfeeding  Find a comfortable place to sit or lie down, with your neck and back well-supported.  Place a pillow or a rolled-up blanket under your baby to bring him or her to the level of your breast (if you are seated). Nursing pillows are specially designed to help support your arms and your baby while you breastfeed.  Make sure that your baby's tummy (abdomen) is facing your abdomen.  Gently massage your breast. With your fingertips, massage from the outer edges of your breast inward toward the nipple. This encourages milk flow. If your milk flows slowly, you may need to continue this action during the feeding.  Support your breast with 4 fingers underneath and your thumb above your nipple (make the letter "C" with your hand). Make sure your fingers are well away from your nipple and your baby's mouth.  Stroke your baby's lips gently with your finger or nipple.  When your baby's mouth is open wide enough, quickly bring your baby to your breast, placing your entire nipple and as much of the areola as possible into your baby's mouth. The areola is the colored area around your nipple. ? More areola should be visible above your  baby's upper lip than below the lower lip. ? Your baby's lips should be opened and extended outward (flanged) to ensure an adequate, comfortable latch. ? Your baby's tongue should be between his or her lower gum and your breast.  Make sure that your baby's mouth is correctly positioned around your nipple (latched). Your baby's lips should create a seal on your breast and be turned out (everted).  It is common for your baby to suck about 2-3 minutes in order to start the flow of breast milk. Latching Teaching your baby how to latch onto your breast properly is very important. An improper latch can cause nipple pain, decreased milk supply, and poor weight gain in your baby. Also, if your baby is not latched onto your nipple properly, he or she may swallow some air during feeding. This can make your baby fussy. Burping your baby when you switch breasts during the feeding can help to get rid of the air. However, teaching your baby to latch on properly is still the best way to prevent fussiness from swallowing air while breastfeeding. Signs that your baby has successfully latched onto   your nipple  Silent tugging or silent sucking, without causing you pain. Infant's lips should be extended outward (flanged).  Swallowing heard between every 3-4 sucks once your milk has started to flow (after your let-down milk reflex occurs).  Muscle movement above and in front of his or her ears while sucking. Signs that your baby has not successfully latched onto your nipple  Sucking sounds or smacking sounds from your baby while breastfeeding.  Nipple pain. If you think your baby has not latched on correctly, slip your finger into the corner of your baby's mouth to break the suction and place it between your baby's gums. Attempt to start breastfeeding again. Signs of successful breastfeeding Signs from your baby  Your baby will gradually decrease the number of sucks or will completely stop sucking.  Your baby  will fall asleep.  Your baby's body will relax.  Your baby will retain a small amount of milk in his or her mouth.  Your baby will let go of your breast by himself or herself. Signs from you  Breasts that have increased in firmness, weight, and size 1-3 hours after feeding.  Breasts that are softer immediately after breastfeeding.  Increased milk volume, as well as a change in milk consistency and color by the fifth day of breastfeeding.  Nipples that are not sore, cracked, or bleeding. Signs that your baby is getting enough milk  Wetting at least 1-2 diapers during the first 24 hours after birth.  Wetting at least 5-6 diapers every 24 hours for the first week after birth. The urine should be clear or pale yellow by the age of 5 days.  Wetting 6-8 diapers every 24 hours as your baby continues to grow and develop.  At least 3 stools in a 24-hour period by the age of 5 days. The stool should be soft and yellow.  At least 3 stools in a 24-hour period by the age of 7 days. The stool should be seedy and yellow.  No loss of weight greater than 10% of birth weight during the first 3 days of life.  Average weight gain of 4-7 oz (113-198 g) per week after the age of 4 days.  Consistent daily weight gain by the age of 5 days, without weight loss after the age of 2 weeks. After a feeding, your baby may spit up a small amount of milk. This is normal. Breastfeeding frequency and duration Frequent feeding will help you make more milk and can prevent sore nipples and extremely full breasts (breast engorgement). Breastfeed when you feel the need to reduce the fullness of your breasts or when your baby shows signs of hunger. This is called "breastfeeding on demand." Signs that your baby is hungry include:  Increased alertness, activity, or restlessness.  Movement of the head from side to side.  Opening of the mouth when the corner of the mouth or cheek is stroked (rooting).  Increased  sucking sounds, smacking lips, cooing, sighing, or squeaking.  Hand-to-mouth movements and sucking on fingers or hands.  Fussing or crying. Avoid introducing a pacifier to your baby in the first 4-6 weeks after your baby is born. After this time, you may choose to use a pacifier. Research has shown that pacifier use during the first year of a baby's life decreases the risk of sudden infant death syndrome (SIDS). Allow your baby to feed on each breast as long as he or she wants. When your baby unlatches or falls asleep while feeding from the   first breast, offer the second breast. Because newborns are often sleepy in the first few weeks of life, you may need to awaken your baby to get him or her to feed. Breastfeeding times will vary from baby to baby. However, the following rules can serve as a guide to help you make sure that your baby is properly fed:  Newborns (babies 4 weeks of age or younger) may breastfeed every 1-3 hours.  Newborns should not go without breastfeeding for longer than 3 hours during the day or 5 hours during the night.  You should breastfeed your baby a minimum of 8 times in a 24-hour period. Breast milk pumping Pumping and storing breast milk allows you to make sure that your baby is exclusively fed your breast milk, even at times when you are unable to breastfeed. This is especially important if you go back to work while you are still breastfeeding, or if you are not able to be present during feedings. Your lactation consultant can help you find a method of pumping that works best for you and give you guidelines about how long it is safe to store breast milk.      Caring for your breasts while you breastfeed Nipples can become dry, cracked, and sore while breastfeeding. The following recommendations can help keep your breasts moisturized and healthy:  Avoid using soap on your nipples.  Wear a supportive bra designed especially for nursing. Avoid wearing underwire-style  bras or extremely tight bras (sports bras).  Air-dry your nipples for 3-4 minutes after each feeding.  Use only cotton bra pads to absorb leaked breast milk. Leaking of breast milk between feedings is normal.  Use lanolin on your nipples after breastfeeding. Lanolin helps to maintain your skin's normal moisture barrier. Pure lanolin is not harmful (not toxic) to your baby. You may also hand express a few drops of breast milk and gently massage that milk into your nipples and allow the milk to air-dry. In the first few weeks after giving birth, some women experience breast engorgement. Engorgement can make your breasts feel heavy, warm, and tender to the touch. Engorgement peaks within 3-5 days after you give birth. The following recommendations can help to ease engorgement:  Completely empty your breasts while breastfeeding or pumping. You may want to start by applying warm, moist heat (in the shower or with warm, water-soaked hand towels) just before feeding or pumping. This increases circulation and helps the milk flow. If your baby does not completely empty your breasts while breastfeeding, pump any extra milk after he or she is finished.  Apply ice packs to your breasts immediately after breastfeeding or pumping, unless this is too uncomfortable for you. To do this: ? Put ice in a plastic bag. ? Place a towel between your skin and the bag. ? Leave the ice on for 20 minutes, 2-3 times a day.  Make sure that your baby is latched on and positioned properly while breastfeeding. If engorgement persists after 48 hours of following these recommendations, contact your health care provider or a lactation consultant. Overall health care recommendations while breastfeeding  Eat 3 healthy meals and 3 snacks every day. Well-nourished mothers who are breastfeeding need an additional 450-500 calories a day. You can meet this requirement by increasing the amount of a balanced diet that you eat.  Drink  enough water to keep your urine pale yellow or clear.  Rest often, relax, and continue to take your prenatal vitamins to prevent fatigue, stress, and low   vitamin and mineral levels in your body (nutrient deficiencies).  Do not use any products that contain nicotine or tobacco, such as cigarettes and e-cigarettes. Your baby may be harmed by chemicals from cigarettes that pass into breast milk and exposure to secondhand smoke. If you need help quitting, ask your health care provider.  Avoid alcohol.  Do not use illegal drugs or marijuana.  Talk with your health care provider before taking any medicines. These include over-the-counter and prescription medicines as well as vitamins and herbal supplements. Some medicines that may be harmful to your baby can pass through breast milk.  It is possible to become pregnant while breastfeeding. If birth control is desired, ask your health care provider about options that will be safe while breastfeeding your baby. Where to find more information: [employer]: www.llli.org Contact a health care provider if:  You feel like you want to stop breastfeeding or have become frustrated with breastfeeding.  Your nipples are cracked or bleeding.  Your breasts are red, tender, or warm.  You have: ? Painful breasts or nipples. ? A swollen area on either breast. ? A fever or chills. ? Nausea or vomiting. ? Drainage other than breast milk from your nipples.  Your breasts do not become full before feedings by the fifth day after you give birth.  You feel sad and depressed.  Your baby is: ? Too sleepy to eat well. ? Having trouble sleeping. ? More than 1 week old and wetting fewer than 6 diapers in a 24-hour period. ? Not gaining weight by 5 days of age.  Your baby has fewer than 3 stools in a 24-hour period.  Your baby's skin or the white parts of his or her eyes become yellow. Get help right away if:  Your baby is overly tired  (lethargic) and does not want to wake up and feed.  Your baby develops an unexplained fever. Summary  Breastfeeding offers many health benefits for infant and mothers.  Try to breastfeed your infant when he or she shows early signs of hunger.  Gently tickle or stroke your baby's lips with your finger or nipple to allow the baby to open his or her mouth. Bring the baby to your breast. Make sure that much of the areola is in your baby's mouth. Offer one side and burp the baby before you offer the other side.  Talk with your health care provider or lactation consultant if you have questions or you face problems as you breastfeed. This information is not intended to replace advice given to you by your health care provider. Make sure you discuss any questions you have with your health care provider. Document Revised: 03/29/2017 Document Reviewed: 02/04/2016 Elsevier Patient Education  2021 Else[employer] Inc.  

## 2020-04-20 NOTE — Progress Notes (Signed)
   Subjective:  Danielle [name] is a 35 y.o. [id number] at [redacted]w[redacted]d being seen today for ongoing prenatal care.  She is currently monitored for the following issues for this high-risk pregnancy and has Rh negative state in antepartum period; Anemia; Hirsutism; Supervision of high risk pregnancy, antepartum; Advanced maternal age in multigravida; History of 2 cesarean sections; History of poor fetal growth; History of preterm delivery; Alpha-thalassemia (HCC); COVID-19 vaccine administered; Chronic hypertension during pregnancy; and Alpha thalassemia silent carrier on their problem list.  Patient reports no complaints.  Contractions: Not present. Vag. Bleeding: None.  Movement: Present. Denies leaking of fluid.   The following portions of the patient's history were reviewed and updated as appropriate: allergies, current medications, past family history, past medical history, past social history, past surgical history and problem list. Problem list updated.  Objective:   Vitals:   04/20/20 1051  BP: (!) 119/57  Pulse: 89  Weight: (!) 396 lb 12.8 oz (180 kg)    Fetal Status: Fetal Heart Rate (bpm): 141   Movement: Present     General:  Alert, oriented and cooperative. Patient is in no acute distress.  Skin: Skin is warm and dry. No rash noted.   Cardiovascular: Normal heart rate noted  Respiratory: Normal respiratory effort, no problems with respiration noted  Abdomen: Soft, gravid, appropriate for gestational age. Pain/Pressure: Absent     Pelvic: Vag. Bleeding: None     Cervical exam deferred        Extremities: Normal range of motion.  Edema: None  Mental Status: Normal mood and affect. Normal behavior. Normal judgment and thought content.   Urinalysis:      Assessment and Plan:  Pregnancy: [id number] at [redacted]w[redacted]d  1. Rh negative state in antepartum period Rhogam today - rho (d) immune globulin (RHIG/RHOPHYLAC) injection 300 mcg  2. Supervision of high risk pregnancy, antepartum BP and FHR  normal Rhogam, TDaP, and 28wk labs today Sent some paperwork in yesterday for work to get some restrictions, discussed that this is handled by front Management consultant and that they need at least a week turn around time to get this completed - Tdap vaccine greater than or equal to 7yo IM  3. History of preterm delivery PPROM and breech at 36 weeks  4. History of 2 cesarean sections For RCS+BTL, BTL papers signed today Message sent to schedule for 39 weeks (07/08/2020)  5. Multigravida of advanced maternal age in third trimester   6. Anemia, unspecified type Last hgb 8.9, sent for Venofer yesterday  7. Chronic hypertension during pregnancy BP normal without meds Following for regular growth scans w MFM  Preterm labor symptoms and general obstetric precautions including but not limited to vaginal bleeding, contractions, leaking of fluid and fetal movement were reviewed in detail with the patient. Please refer to After Visit Summary for other counseling recommendations.  Return in 2 weeks (on 05/04/2020).   Venora Maples, MD

## 2020-04-20 NOTE — Progress Notes (Signed)
BTL signed 04/20/20

## 2020-04-21 LAB — CBC
Hematocrit: 29.9 % — ABNORMAL LOW (ref 34.0–46.6)
Hemoglobin: 9.2 g/dL — ABNORMAL LOW (ref 11.1–15.9)
MCH: 23.1 pg — ABNORMAL LOW (ref 26.6–33.0)
MCHC: 30.8 g/dL — ABNORMAL LOW (ref 31.5–35.7)
MCV: 75 fL — ABNORMAL LOW (ref 79–97)
Platelets: 278 10*3/uL (ref 150–450)
RBC: 3.99 x10E6/uL (ref 3.77–5.28)
RDW: 13.4 % (ref 11.7–15.4)
WBC: 8.8 10*3/uL (ref 3.4–10.8)

## 2020-04-21 LAB — GLUCOSE TOLERANCE, 2 HOURS W/ 1HR
Glucose, 1 hour: 148 mg/dL (ref 65–179)
Glucose, 2 hour: 50 mg/dL — ABNORMAL LOW (ref 65–152)
Glucose, Fasting: 81 mg/dL (ref 65–91)

## 2020-04-21 LAB — RPR: RPR Ser Ql: NONREACTIVE

## 2020-04-21 LAB — HIV ANTIBODY (ROUTINE TESTING W REFLEX): HIV Screen 4th Generation wRfx: NONREACTIVE

## 2020-04-21 LAB — ANTIBODY SCREEN: Antibody Screen: NEGATIVE

## 2020-04-22 ENCOUNTER — Encounter (HOSPITAL_COMMUNITY): Payer: Medicaid Other

## 2020-05-05 ENCOUNTER — Ambulatory Visit (INDEPENDENT_AMBULATORY_CARE_PROVIDER_SITE_OTHER): Payer: Medicaid Other | Admitting: Family Medicine

## 2020-05-05 VITALS — BP 127/72 | HR 86 | Wt 394.9 lb

## 2020-05-05 DIAGNOSIS — Z6791 Unspecified blood type, Rh negative: Secondary | ICD-10-CM

## 2020-05-05 DIAGNOSIS — D563 Thalassemia minor: Secondary | ICD-10-CM

## 2020-05-05 DIAGNOSIS — O0990 Supervision of high risk pregnancy, unspecified, unspecified trimester: Secondary | ICD-10-CM

## 2020-05-05 DIAGNOSIS — Z8751 Personal history of pre-term labor: Secondary | ICD-10-CM

## 2020-05-05 DIAGNOSIS — O09523 Supervision of elderly multigravida, third trimester: Secondary | ICD-10-CM

## 2020-05-05 DIAGNOSIS — O10919 Unspecified pre-existing hypertension complicating pregnancy, unspecified trimester: Secondary | ICD-10-CM

## 2020-05-05 DIAGNOSIS — O26899 Other specified pregnancy related conditions, unspecified trimester: Secondary | ICD-10-CM

## 2020-05-05 DIAGNOSIS — Z98891 History of uterine scar from previous surgery: Secondary | ICD-10-CM

## 2020-05-05 MED ORDER — IRON (FERROUS GLUCONATE) 256 (28 FE) MG PO TABS: 1.0000 | ORAL_TABLET | ORAL | 3 refills | Status: AC

## 2020-05-05 NOTE — Progress Notes (Signed)
   PRENATAL VISIT NOTE  Subjective:  Danielle [name] is a 35 y.o. [id number] at [redacted]w[redacted]d being seen today for ongoing prenatal care.  She is currently monitored for the following issues for this high-risk pregnancy and has Rh negative state in antepartum period; Anemia; Hirsutism; Supervision of high risk pregnancy, antepartum; Advanced maternal age in multigravida; History of 2 cesarean sections; History of poor fetal growth; History of preterm delivery; Alpha-thalassemia (HCC); COVID-19 vaccine administered; Chronic hypertension during pregnancy; and Alpha thalassemia silent carrier on their problem list.  Patient reports no complaints.  Contractions: Not present. Vag. Bleeding: None.  Movement: Present. Denies leaking of fluid.   The following portions of the patient's history were reviewed and updated as appropriate: allergies, current medications, past family history, past medical history, past social history, past surgical history and problem list.   Objective:   Vitals:   05/05/20 1611  BP: 127/72  Pulse: 86  Weight: (!) 394 lb 14.4 oz (179.1 kg)    Fetal Status: Fetal Heart Rate (bpm): 145   Movement: Present     General:  Alert, oriented and cooperative. Patient is in no acute distress.  Skin: Skin is warm and dry. No rash noted.   Cardiovascular: Normal heart rate noted  Respiratory: Normal respiratory effort, no problems with respiration noted  Abdomen: Soft, gravid, appropriate for gestational age.  Pain/Pressure: Absent     Pelvic: Cervical exam deferred        Extremities: Normal range of motion.  Edema: None  Mental Status: Normal mood and affect. Normal behavior. Normal judgment and thought content.   Assessment and Plan:  Pregnancy: [id number] at [redacted]w[redacted]d 1. Supervision of high risk pregnancy, antepartum FHT normal  2. Chronic hypertension during pregnancy BP controlled. Off medications. Last [place] 4/5: EFW 46% [place] scheduled 5/4.  3. Alpha thalassemia silent carrier With  chronic anemia Had iron infusion  4. Multigravida of advanced maternal age in third trimester  5. History of 2 cesarean sections Scheduled for rpt c/s with BTL  6. History of preterm delivery  7. Rh negative state in antepartum period S/p rhogam  Preterm labor symptoms and general obstetric precautions including but not limited to vaginal bleeding, contractions, leaking of fluid and fetal movement were reviewed in detail with the patient. Please refer to After Visit Summary for other counseling recommendations.   No follow-ups on file.  Future Appointments  Date Time Provider Department Center  05/19/2020  7:45 AM [facility] NURSE [facility]  05/19/2020  8:00 AM [facility] US1 [facility]  05/19/2020 10:55 AM Crissie Reese, Mary Sella, MD [facility]    Levie Heritage, DO

## 2020-05-05 NOTE — Progress Notes (Signed)
Pelvic pain and back pain

## 2020-05-19 ENCOUNTER — Other Ambulatory Visit: Payer: Self-pay | Admitting: *Deleted

## 2020-05-19 ENCOUNTER — Encounter: Payer: Self-pay | Admitting: Family Medicine

## 2020-05-19 ENCOUNTER — Ambulatory Visit: Payer: Medicaid Other | Attending: Maternal & Fetal Medicine

## 2020-05-19 ENCOUNTER — Other Ambulatory Visit: Payer: Self-pay

## 2020-05-19 ENCOUNTER — Ambulatory Visit: Payer: Medicaid Other | Admitting: *Deleted

## 2020-05-19 ENCOUNTER — Ambulatory Visit (INDEPENDENT_AMBULATORY_CARE_PROVIDER_SITE_OTHER): Payer: Medicaid Other | Admitting: Family Medicine

## 2020-05-19 ENCOUNTER — Encounter: Payer: Self-pay | Admitting: *Deleted

## 2020-05-19 VITALS — BP 134/75 | HR 77 | Wt 398.0 lb

## 2020-05-19 DIAGNOSIS — Z87898 Personal history of other specified conditions: Secondary | ICD-10-CM | POA: Diagnosis present

## 2020-05-19 DIAGNOSIS — O34219 Maternal care for unspecified type scar from previous cesarean delivery: Secondary | ICD-10-CM

## 2020-05-19 DIAGNOSIS — Z98891 History of uterine scar from previous surgery: Secondary | ICD-10-CM

## 2020-05-19 DIAGNOSIS — O99213 Obesity complicating pregnancy, third trimester: Secondary | ICD-10-CM

## 2020-05-19 DIAGNOSIS — Z8751 Personal history of pre-term labor: Secondary | ICD-10-CM

## 2020-05-19 DIAGNOSIS — R638 Other symptoms and signs concerning food and fluid intake: Secondary | ICD-10-CM | POA: Diagnosis not present

## 2020-05-19 DIAGNOSIS — O09523 Supervision of elderly multigravida, third trimester: Secondary | ICD-10-CM

## 2020-05-19 DIAGNOSIS — Z362 Encounter for other antenatal screening follow-up: Secondary | ICD-10-CM | POA: Diagnosis not present

## 2020-05-19 DIAGNOSIS — O0990 Supervision of high risk pregnancy, unspecified, unspecified trimester: Secondary | ICD-10-CM

## 2020-05-19 DIAGNOSIS — D649 Anemia, unspecified: Secondary | ICD-10-CM

## 2020-05-19 DIAGNOSIS — Z6791 Unspecified blood type, Rh negative: Secondary | ICD-10-CM

## 2020-05-19 DIAGNOSIS — Z3A31 31 weeks gestation of pregnancy: Secondary | ICD-10-CM | POA: Diagnosis not present

## 2020-05-19 DIAGNOSIS — E669 Obesity, unspecified: Secondary | ICD-10-CM

## 2020-05-19 DIAGNOSIS — O99843 Bariatric surgery status complicating pregnancy, third trimester: Secondary | ICD-10-CM | POA: Diagnosis not present

## 2020-05-19 DIAGNOSIS — O26899 Other specified pregnancy related conditions, unspecified trimester: Secondary | ICD-10-CM

## 2020-05-19 DIAGNOSIS — O10919 Unspecified pre-existing hypertension complicating pregnancy, unspecified trimester: Secondary | ICD-10-CM

## 2020-05-19 DIAGNOSIS — O09213 Supervision of pregnancy with history of pre-term labor, third trimester: Secondary | ICD-10-CM

## 2020-05-19 NOTE — Addendum Note (Signed)
Addended by: Kathee Delton on: 05/19/2020 10:53 AM   Modules accepted: Orders

## 2020-05-19 NOTE — Progress Notes (Signed)
   Subjective:  Danielle [name] is a 35 y.o. [id number] at [redacted]w[redacted]d being seen today for ongoing prenatal care.  She is currently monitored for the following issues for this high-risk pregnancy and has Rh negative state in antepartum period; Anemia; Hirsutism; Supervision of high risk pregnancy, antepartum; Advanced maternal age in multigravida; History of 2 cesarean sections; History of poor fetal growth; History of preterm delivery; Alpha-thalassemia (HCC); COVID-19 vaccine administered; Chronic hypertension during pregnancy; and Alpha thalassemia silent carrier on their problem list.  Patient reports no complaints.  Contractions: Irritability. Vag. Bleeding: None.  Movement: Present. Denies leaking of fluid.   The following portions of the patient's history were reviewed and updated as appropriate: allergies, current medications, past family history, past medical history, past social history, past surgical history and problem list. Problem list updated.  Objective:   Vitals:   05/19/20 0941  BP: 134/75  Pulse: 77  Weight: (!) 398 lb (180.5 kg)    Fetal Status: Fetal Heart Rate (bpm): u/s   Movement: Present     General:  Alert, oriented and cooperative. Patient is in no acute distress.  Skin: Skin is warm and dry. No rash noted.   Cardiovascular: Normal heart rate noted  Respiratory: Normal respiratory effort, no problems with respiration noted  Abdomen: Soft, gravid, appropriate for gestational age. Pain/Pressure: Present     Pelvic: Vag. Bleeding: None     Cervical exam deferred        Extremities: Normal range of motion.  Edema: Trace  Mental Status: Normal mood and affect. Normal behavior. Normal judgment and thought content.   Urinalysis:      Assessment and Plan:  Pregnancy: [id number] at [redacted]w[redacted]d  1. Chronic hypertension during pregnancy Stable, normal BP off meds Following regularly w MFM Per their recs will start weekly testing starting next week  2. Supervision of high risk  pregnancy, antepartum FHR normal in MFM, BP normal  3. History of 2 cesarean sections Scheduled for RCS+BTL  4. Rh negative state in antepartum period S/p rhogam  5. Anemia, unspecified type S/p IV iron, last hgb 9.2 on 28wk labs  6. Multigravida of advanced maternal age in third trimester   7. History of preterm delivery PPROM and breech at 36 weeks  Preterm labor symptoms and general obstetric precautions including but not limited to vaginal bleeding, contractions, leaking of fluid and fetal movement were reviewed in detail with the patient. Please refer to After Visit Summary for other counseling recommendations.  Return in 2 weeks (on 06/02/2020) for [facility], ob visit.   Venora Maples, MD

## 2020-05-19 NOTE — Patient Instructions (Signed)
 Contraception Choices Contraception, also called birth control, refers to methods or devices that prevent pregnancy. Hormonal methods Contraceptive implant A contraceptive implant is a thin, plastic tube that contains a hormone that prevents pregnancy. It is different from an intrauterine device (IUD). It is inserted into the upper part of the arm by a health care provider. Implants can be effective for up to 3 years. Progestin-only injections Progestin-only injections are injections of progestin, a synthetic form of the hormone progesterone. They are given every 3 months by a health care provider. Birth control pills Birth control pills are pills that contain hormones that prevent pregnancy. They must be taken once a day, preferably at the same time each day. A prescription is needed to use this method of contraception. Birth control patch The birth control patch contains hormones that prevent pregnancy. It is placed on the skin and must be changed once a week for three weeks and removed on the fourth week. A prescription is needed to use this method of contraception. Vaginal ring A vaginal ring contains hormones that prevent pregnancy. It is placed in the vagina for three weeks and removed on the fourth week. After that, the process is repeated with a new ring. A prescription is needed to use this method of contraception. Emergency contraceptive Emergency contraceptives prevent pregnancy after unprotected sex. They come in pill form and can be taken up to 5 days after sex. They work best the sooner they are taken after having sex. Most emergency contraceptives are available without a prescription. This method should not be used as your only form of birth control.   Barrier methods Female condom A female condom is a thin sheath that is worn over the penis during sex. Condoms keep sperm from going inside a woman's body. They can be used with a sperm-killing substance (spermicide) to increase their  effectiveness. They should be thrown away after one use. Female condom A female condom is a soft, loose-fitting sheath that is put into the vagina before sex. The condom keeps sperm from going inside a woman's body. They should be thrown away after one use. Diaphragm A diaphragm is a soft, dome-shaped barrier. It is inserted into the vagina before sex, along with a spermicide. The diaphragm blocks sperm from entering the uterus, and the spermicide kills sperm. A diaphragm should be left in the vagina for 6-8 hours after sex and removed within 24 hours. A diaphragm is prescribed and fitted by a health care provider. A diaphragm should be replaced every 1-2 years, after giving birth, after gaining more than 15 lb (6.8 kg), and after pelvic surgery. Cervical cap A cervical cap is a round, soft latex or plastic cup that fits over the cervix. It is inserted into the vagina before sex, along with spermicide. It blocks sperm from entering the uterus. The cap should be left in place for 6-8 hours after sex and removed within 48 hours. A cervical cap must be prescribed and fitted by a health care provider. It should be replaced every 2 years. Sponge A sponge is a soft, circular piece of polyurethane foam with spermicide in it. The sponge helps block sperm from entering the uterus, and the spermicide kills sperm. To use it, you make it wet and then insert it into the vagina. It should be inserted before sex, left in for at least 6 hours after sex, and removed and thrown away within 30 hours. Spermicides Spermicides are chemicals that kill or block sperm from entering the   cervix and uterus. They can come as a cream, jelly, suppository, foam, or tablet. A spermicide should be inserted into the vagina with an applicator at least 10-15 minutes before sex to allow time for it to work. The process must be repeated every time you have sex. Spermicides do not require a prescription.   Intrauterine  contraception Intrauterine device (IUD) An IUD is a T-shaped device that is put in a woman's uterus. There are two types:  Hormone IUD.This type contains progestin, a synthetic form of the hormone progesterone. This type can stay in place for 3-5 years.  Copper IUD.This type is wrapped in copper wire. It can stay in place for 10 years. Permanent methods of contraception Female tubal ligation In this method, a woman's fallopian tubes are sealed, tied, or blocked during surgery to prevent eggs from traveling to the uterus. Hysteroscopic sterilization In this method, a small, flexible insert is placed into each fallopian tube. The inserts cause scar tissue to form in the fallopian tubes and block them, so sperm cannot reach an egg. The procedure takes about 3 months to be effective. Another form of birth control must be used during those 3 months. Female sterilization This is a procedure to tie off the tubes that carry sperm (vasectomy). After the procedure, the man can still ejaculate fluid (semen). Another form of birth control must be used for 3 months after the procedure. Natural planning methods Natural family planning In this method, a couple does not have sex on days when the woman could become pregnant. Calendar method In this method, the woman keeps track of the length of each menstrual cycle, identifies the days when pregnancy can happen, and does not have sex on those days. Ovulation method In this method, a couple avoids sex during ovulation. Symptothermal method This method involves not having sex during ovulation. The woman typically checks for ovulation by watching changes in her temperature and in the consistency of cervical mucus. Post-ovulation method In this method, a couple waits to have sex until after ovulation. Where to find more information  Centers for Disease Control and Prevention: www.cdc.gov Summary  Contraception, also called birth control, refers to methods or  devices that prevent pregnancy.  Hormonal methods of contraception include implants, injections, pills, patches, vaginal rings, and emergency contraceptives.  Barrier methods of contraception can include female condoms, female condoms, diaphragms, cervical caps, sponges, and spermicides.  There are two types of IUDs (intrauterine devices). An IUD can be put in a woman's uterus to prevent pregnancy for 3-5 years.  Permanent sterilization can be done through a procedure for males and females. Natural family planning methods involve nothaving sex on days when the woman could become pregnant. This information is not intended to replace advice given to you by your health care provider. Make sure you discuss any questions you have with your health care provider. Document Revised: 06/09/2019 Document Reviewed: 06/09/2019 Elsevier Patient Education  2021 [employer] Inc.   Breastfeeding  Choosing to breastfeed is one of the best decisions you can make for yourself and your baby. A change in hormones during pregnancy causes your breasts to make breast milk in your milk-producing glands. Hormones prevent breast milk from being released before your baby is born. They also prompt milk flow after birth. Once breastfeeding has begun, thoughts of your baby, as well as his or her sucking or crying, can stimulate the release of milk from your milk-producing glands. Benefits of breastfeeding Research shows that breastfeeding offers many health benefits   for infants and mothers. It also offers a cost-free and convenient way to feed your baby. For your baby  Your first milk (colostrum) helps your baby's digestive system to function better.  Special cells in your milk (antibodies) help your baby to fight off infections.  Breastfed babies are less likely to develop asthma, allergies, obesity, or type 2 diabetes. They are also at lower risk for sudden infant death syndrome (SIDS).  Nutrients in breast milk are better  able to meet your baby's needs compared to infant formula.  Breast milk improves your baby's brain development. For you  Breastfeeding helps to create a very special bond between you and your baby.  Breastfeeding is convenient. Breast milk costs nothing and is always available at the correct temperature.  Breastfeeding helps to burn calories. It helps you to lose the weight that you gained during pregnancy.  Breastfeeding makes your uterus return faster to its size before pregnancy. It also slows bleeding (lochia) after you give birth.  Breastfeeding helps to lower your risk of developing type 2 diabetes, osteoporosis, rheumatoid arthritis, cardiovascular disease, and breast, ovarian, uterine, and endometrial cancer later in life. Breastfeeding basics Starting breastfeeding  Find a comfortable place to sit or lie down, with your neck and back well-supported.  Place a pillow or a rolled-up blanket under your baby to bring him or her to the level of your breast (if you are seated). Nursing pillows are specially designed to help support your arms and your baby while you breastfeed.  Make sure that your baby's tummy (abdomen) is facing your abdomen.  Gently massage your breast. With your fingertips, massage from the outer edges of your breast inward toward the nipple. This encourages milk flow. If your milk flows slowly, you may need to continue this action during the feeding.  Support your breast with 4 fingers underneath and your thumb above your nipple (make the letter "C" with your hand). Make sure your fingers are well away from your nipple and your baby's mouth.  Stroke your baby's lips gently with your finger or nipple.  When your baby's mouth is open wide enough, quickly bring your baby to your breast, placing your entire nipple and as much of the areola as possible into your baby's mouth. The areola is the colored area around your nipple. ? More areola should be visible above your  baby's upper lip than below the lower lip. ? Your baby's lips should be opened and extended outward (flanged) to ensure an adequate, comfortable latch. ? Your baby's tongue should be between his or her lower gum and your breast.  Make sure that your baby's mouth is correctly positioned around your nipple (latched). Your baby's lips should create a seal on your breast and be turned out (everted).  It is common for your baby to suck about 2-3 minutes in order to start the flow of breast milk. Latching Teaching your baby how to latch onto your breast properly is very important. An improper latch can cause nipple pain, decreased milk supply, and poor weight gain in your baby. Also, if your baby is not latched onto your nipple properly, he or she may swallow some air during feeding. This can make your baby fussy. Burping your baby when you switch breasts during the feeding can help to get rid of the air. However, teaching your baby to latch on properly is still the best way to prevent fussiness from swallowing air while breastfeeding. Signs that your baby has successfully latched onto   your nipple  Silent tugging or silent sucking, without causing you pain. Infant's lips should be extended outward (flanged).  Swallowing heard between every 3-4 sucks once your milk has started to flow (after your let-down milk reflex occurs).  Muscle movement above and in front of his or her ears while sucking. Signs that your baby has not successfully latched onto your nipple  Sucking sounds or smacking sounds from your baby while breastfeeding.  Nipple pain. If you think your baby has not latched on correctly, slip your finger into the corner of your baby's mouth to break the suction and place it between your baby's gums. Attempt to start breastfeeding again. Signs of successful breastfeeding Signs from your baby  Your baby will gradually decrease the number of sucks or will completely stop sucking.  Your baby  will fall asleep.  Your baby's body will relax.  Your baby will retain a small amount of milk in his or her mouth.  Your baby will let go of your breast by himself or herself. Signs from you  Breasts that have increased in firmness, weight, and size 1-3 hours after feeding.  Breasts that are softer immediately after breastfeeding.  Increased milk volume, as well as a change in milk consistency and color by the fifth day of breastfeeding.  Nipples that are not sore, cracked, or bleeding. Signs that your baby is getting enough milk  Wetting at least 1-2 diapers during the first 24 hours after birth.  Wetting at least 5-6 diapers every 24 hours for the first week after birth. The urine should be clear or pale yellow by the age of 5 days.  Wetting 6-8 diapers every 24 hours as your baby continues to grow and develop.  At least 3 stools in a 24-hour period by the age of 5 days. The stool should be soft and yellow.  At least 3 stools in a 24-hour period by the age of 7 days. The stool should be seedy and yellow.  No loss of weight greater than 10% of birth weight during the first 3 days of life.  Average weight gain of 4-7 oz (113-198 g) per week after the age of 4 days.  Consistent daily weight gain by the age of 5 days, without weight loss after the age of 2 weeks. After a feeding, your baby may spit up a small amount of milk. This is normal. Breastfeeding frequency and duration Frequent feeding will help you make more milk and can prevent sore nipples and extremely full breasts (breast engorgement). Breastfeed when you feel the need to reduce the fullness of your breasts or when your baby shows signs of hunger. This is called "breastfeeding on demand." Signs that your baby is hungry include:  Increased alertness, activity, or restlessness.  Movement of the head from side to side.  Opening of the mouth when the corner of the mouth or cheek is stroked (rooting).  Increased  sucking sounds, smacking lips, cooing, sighing, or squeaking.  Hand-to-mouth movements and sucking on fingers or hands.  Fussing or crying. Avoid introducing a pacifier to your baby in the first 4-6 weeks after your baby is born. After this time, you may choose to use a pacifier. Research has shown that pacifier use during the first year of a baby's life decreases the risk of sudden infant death syndrome (SIDS). Allow your baby to feed on each breast as long as he or she wants. When your baby unlatches or falls asleep while feeding from the   first breast, offer the second breast. Because newborns are often sleepy in the first few weeks of life, you may need to awaken your baby to get him or her to feed. Breastfeeding times will vary from baby to baby. However, the following rules can serve as a guide to help you make sure that your baby is properly fed:  Newborns (babies 4 weeks of age or younger) may breastfeed every 1-3 hours.  Newborns should not go without breastfeeding for longer than 3 hours during the day or 5 hours during the night.  You should breastfeed your baby a minimum of 8 times in a 24-hour period. Breast milk pumping Pumping and storing breast milk allows you to make sure that your baby is exclusively fed your breast milk, even at times when you are unable to breastfeed. This is especially important if you go back to work while you are still breastfeeding, or if you are not able to be present during feedings. Your lactation consultant can help you find a method of pumping that works best for you and give you guidelines about how long it is safe to store breast milk.      Caring for your breasts while you breastfeed Nipples can become dry, cracked, and sore while breastfeeding. The following recommendations can help keep your breasts moisturized and healthy:  Avoid using soap on your nipples.  Wear a supportive bra designed especially for nursing. Avoid wearing underwire-style  bras or extremely tight bras (sports bras).  Air-dry your nipples for 3-4 minutes after each feeding.  Use only cotton bra pads to absorb leaked breast milk. Leaking of breast milk between feedings is normal.  Use lanolin on your nipples after breastfeeding. Lanolin helps to maintain your skin's normal moisture barrier. Pure lanolin is not harmful (not toxic) to your baby. You may also hand express a few drops of breast milk and gently massage that milk into your nipples and allow the milk to air-dry. In the first few weeks after giving birth, some women experience breast engorgement. Engorgement can make your breasts feel heavy, warm, and tender to the touch. Engorgement peaks within 3-5 days after you give birth. The following recommendations can help to ease engorgement:  Completely empty your breasts while breastfeeding or pumping. You may want to start by applying warm, moist heat (in the shower or with warm, water-soaked hand towels) just before feeding or pumping. This increases circulation and helps the milk flow. If your baby does not completely empty your breasts while breastfeeding, pump any extra milk after he or she is finished.  Apply ice packs to your breasts immediately after breastfeeding or pumping, unless this is too uncomfortable for you. To do this: ? Put ice in a plastic bag. ? Place a towel between your skin and the bag. ? Leave the ice on for 20 minutes, 2-3 times a day.  Make sure that your baby is latched on and positioned properly while breastfeeding. If engorgement persists after 48 hours of following these recommendations, contact your health care provider or a lactation consultant. Overall health care recommendations while breastfeeding  Eat 3 healthy meals and 3 snacks every day. Well-nourished mothers who are breastfeeding need an additional 450-500 calories a day. You can meet this requirement by increasing the amount of a balanced diet that you eat.  Drink  enough water to keep your urine pale yellow or clear.  Rest often, relax, and continue to take your prenatal vitamins to prevent fatigue, stress, and low   vitamin and mineral levels in your body (nutrient deficiencies).  Do not use any products that contain nicotine or tobacco, such as cigarettes and e-cigarettes. Your baby may be harmed by chemicals from cigarettes that pass into breast milk and exposure to secondhand smoke. If you need help quitting, ask your health care provider.  Avoid alcohol.  Do not use illegal drugs or marijuana.  Talk with your health care provider before taking any medicines. These include over-the-counter and prescription medicines as well as vitamins and herbal supplements. Some medicines that may be harmful to your baby can pass through breast milk.  It is possible to become pregnant while breastfeeding. If birth control is desired, ask your health care provider about options that will be safe while breastfeeding your baby. Where to find more information: [employer]: www.llli.org Contact a health care provider if:  You feel like you want to stop breastfeeding or have become frustrated with breastfeeding.  Your nipples are cracked or bleeding.  Your breasts are red, tender, or warm.  You have: ? Painful breasts or nipples. ? A swollen area on either breast. ? A fever or chills. ? Nausea or vomiting. ? Drainage other than breast milk from your nipples.  Your breasts do not become full before feedings by the fifth day after you give birth.  You feel sad and depressed.  Your baby is: ? Too sleepy to eat well. ? Having trouble sleeping. ? More than 1 week old and wetting fewer than 6 diapers in a 24-hour period. ? Not gaining weight by 5 days of age.  Your baby has fewer than 3 stools in a 24-hour period.  Your baby's skin or the white parts of his or her eyes become yellow. Get help right away if:  Your baby is overly tired  (lethargic) and does not want to wake up and feed.  Your baby develops an unexplained fever. Summary  Breastfeeding offers many health benefits for infant and mothers.  Try to breastfeed your infant when he or she shows early signs of hunger.  Gently tickle or stroke your baby's lips with your finger or nipple to allow the baby to open his or her mouth. Bring the baby to your breast. Make sure that much of the areola is in your baby's mouth. Offer one side and burp the baby before you offer the other side.  Talk with your health care provider or lactation consultant if you have questions or you face problems as you breastfeed. This information is not intended to replace advice given to you by your health care provider. Make sure you discuss any questions you have with your health care provider. Document Revised: 03/29/2017 Document Reviewed: 02/04/2016 Elsevier Patient Education  2021 Else[employer] Inc.  

## 2020-05-20 NOTE — Progress Notes (Signed)
   Subjective:  Danielle [name] is a 35 y.o. [id number] at [redacted]w[redacted]d being seen today for ongoing prenatal care.  She is currently monitored for the following issues for this high-risk pregnancy and has Rh negative state in antepartum period; Anemia; Hirsutism; Supervision of high risk pregnancy, antepartum; Advanced maternal age in multigravida; History of 2 cesarean sections; History of poor fetal growth; History of preterm delivery; Alpha-thalassemia (HCC); COVID-19 vaccine administered; Chronic hypertension during pregnancy; and Alpha thalassemia silent carrier on their problem list.  Patient reports no complaints.  Contractions: Irritability. Vag. Bleeding: None.  Movement: Present. Denies leaking of fluid.   The following portions of the patient's history were reviewed and updated as appropriate: allergies, current medications, past family history, past medical history, past social history, past surgical history and problem list. Problem list updated.  Objective:   Vitals:   06/01/20 1102  BP: (!) 142/78  Pulse: (!) 111  Weight: (!) 395 lb 6.4 oz (179.4 kg)    Fetal Status: Fetal Heart Rate (bpm): 152   Movement: Present     General:  Alert, oriented and cooperative. Patient is in no acute distress.  Skin: Skin is warm and dry. No rash noted.   Cardiovascular: Normal heart rate noted  Respiratory: Normal respiratory effort, no problems with respiration noted  Abdomen: Soft, gravid, appropriate for gestational age. Pain/Pressure: Present     Pelvic: Vag. Bleeding: None     Cervical exam deferred        Extremities: Normal range of motion.  Edema: Trace  Mental Status: Normal mood and affect. Normal behavior. Normal judgment and thought content.   Urinalysis:      Assessment and Plan:  Pregnancy: [id number] at [redacted]w[redacted]d  1. Supervision of high risk pregnancy, antepartum FHR normal, BP slightly elevated, see below  2. Rh negative state in antepartum period S/p rhogam  3. Chronic  hypertension during pregnancy Not on meds, initial BP 142/78, repeat is 121/76 Defer meds at this time Has MFM scheduled later this month, to start BPP's weekly at that time per last MFM note  4. Anemia, unspecified type S/p IV iron  5. Multigravida of advanced maternal age in third trimester   6. History of 2 cesarean sections Scheduled for RCS+BTL  Preterm labor symptoms and general obstetric precautions including but not limited to vaginal bleeding, contractions, leaking of fluid and fetal movement were reviewed in detail with the patient. Please refer to After Visit Summary for other counseling recommendations.  Return in 2 weeks (on 06/15/2020) for ob visit.   Venora Maples, MD

## 2020-05-25 ENCOUNTER — Ambulatory Visit: Payer: Medicaid Other

## 2020-05-25 ENCOUNTER — Other Ambulatory Visit: Payer: Medicaid Other

## 2020-06-01 ENCOUNTER — Encounter: Payer: Medicaid Other | Admitting: Family Medicine

## 2020-06-01 ENCOUNTER — Other Ambulatory Visit: Payer: Self-pay

## 2020-06-01 ENCOUNTER — Encounter: Payer: Self-pay | Admitting: Family Medicine

## 2020-06-01 ENCOUNTER — Ambulatory Visit (INDEPENDENT_AMBULATORY_CARE_PROVIDER_SITE_OTHER): Payer: Medicaid Other | Admitting: Family Medicine

## 2020-06-01 VITALS — BP 121/76 | HR 111 | Wt 395.4 lb

## 2020-06-01 DIAGNOSIS — O09523 Supervision of elderly multigravida, third trimester: Secondary | ICD-10-CM

## 2020-06-01 DIAGNOSIS — O26899 Other specified pregnancy related conditions, unspecified trimester: Secondary | ICD-10-CM

## 2020-06-01 DIAGNOSIS — O10919 Unspecified pre-existing hypertension complicating pregnancy, unspecified trimester: Secondary | ICD-10-CM

## 2020-06-01 DIAGNOSIS — Z6791 Unspecified blood type, Rh negative: Secondary | ICD-10-CM

## 2020-06-01 DIAGNOSIS — O0990 Supervision of high risk pregnancy, unspecified, unspecified trimester: Secondary | ICD-10-CM

## 2020-06-01 DIAGNOSIS — D649 Anemia, unspecified: Secondary | ICD-10-CM

## 2020-06-01 DIAGNOSIS — Z98891 History of uterine scar from previous surgery: Secondary | ICD-10-CM

## 2020-06-01 NOTE — Patient Instructions (Signed)
 Contraception Choices Contraception, also called birth control, refers to methods or devices that prevent pregnancy. Hormonal methods Contraceptive implant A contraceptive implant is a thin, plastic tube that contains a hormone that prevents pregnancy. It is different from an intrauterine device (IUD). It is inserted into the upper part of the arm by a health care provider. Implants can be effective for up to 3 years. Progestin-only injections Progestin-only injections are injections of progestin, a synthetic form of the hormone progesterone. They are given every 3 months by a health care provider. Birth control pills Birth control pills are pills that contain hormones that prevent pregnancy. They must be taken once a day, preferably at the same time each day. A prescription is needed to use this method of contraception. Birth control patch The birth control patch contains hormones that prevent pregnancy. It is placed on the skin and must be changed once a week for three weeks and removed on the fourth week. A prescription is needed to use this method of contraception. Vaginal ring A vaginal ring contains hormones that prevent pregnancy. It is placed in the vagina for three weeks and removed on the fourth week. After that, the process is repeated with a new ring. A prescription is needed to use this method of contraception. Emergency contraceptive Emergency contraceptives prevent pregnancy after unprotected sex. They come in pill form and can be taken up to 5 days after sex. They work best the sooner they are taken after having sex. Most emergency contraceptives are available without a prescription. This method should not be used as your only form of birth control.   Barrier methods Female condom A female condom is a thin sheath that is worn over the penis during sex. Condoms keep sperm from going inside a woman's body. They can be used with a sperm-killing substance (spermicide) to increase their  effectiveness. They should be thrown away after one use. Female condom A female condom is a soft, loose-fitting sheath that is put into the vagina before sex. The condom keeps sperm from going inside a woman's body. They should be thrown away after one use. Diaphragm A diaphragm is a soft, dome-shaped barrier. It is inserted into the vagina before sex, along with a spermicide. The diaphragm blocks sperm from entering the uterus, and the spermicide kills sperm. A diaphragm should be left in the vagina for 6-8 hours after sex and removed within 24 hours. A diaphragm is prescribed and fitted by a health care provider. A diaphragm should be replaced every 1-2 years, after giving birth, after gaining more than 15 lb (6.8 kg), and after pelvic surgery. Cervical cap A cervical cap is a round, soft latex or plastic cup that fits over the cervix. It is inserted into the vagina before sex, along with spermicide. It blocks sperm from entering the uterus. The cap should be left in place for 6-8 hours after sex and removed within 48 hours. A cervical cap must be prescribed and fitted by a health care provider. It should be replaced every 2 years. Sponge A sponge is a soft, circular piece of polyurethane foam with spermicide in it. The sponge helps block sperm from entering the uterus, and the spermicide kills sperm. To use it, you make it wet and then insert it into the vagina. It should be inserted before sex, left in for at least 6 hours after sex, and removed and thrown away within 30 hours. Spermicides Spermicides are chemicals that kill or block sperm from entering the   cervix and uterus. They can come as a cream, jelly, suppository, foam, or tablet. A spermicide should be inserted into the vagina with an applicator at least 10-15 minutes before sex to allow time for it to work. The process must be repeated every time you have sex. Spermicides do not require a prescription.   Intrauterine  contraception Intrauterine device (IUD) An IUD is a T-shaped device that is put in a woman's uterus. There are two types:  Hormone IUD.This type contains progestin, a synthetic form of the hormone progesterone. This type can stay in place for 3-5 years.  Copper IUD.This type is wrapped in copper wire. It can stay in place for 10 years. Permanent methods of contraception Female tubal ligation In this method, a woman's fallopian tubes are sealed, tied, or blocked during surgery to prevent eggs from traveling to the uterus. Hysteroscopic sterilization In this method, a small, flexible insert is placed into each fallopian tube. The inserts cause scar tissue to form in the fallopian tubes and block them, so sperm cannot reach an egg. The procedure takes about 3 months to be effective. Another form of birth control must be used during those 3 months. Female sterilization This is a procedure to tie off the tubes that carry sperm (vasectomy). After the procedure, the man can still ejaculate fluid (semen). Another form of birth control must be used for 3 months after the procedure. Natural planning methods Natural family planning In this method, a couple does not have sex on days when the woman could become pregnant. Calendar method In this method, the woman keeps track of the length of each menstrual cycle, identifies the days when pregnancy can happen, and does not have sex on those days. Ovulation method In this method, a couple avoids sex during ovulation. Symptothermal method This method involves not having sex during ovulation. The woman typically checks for ovulation by watching changes in her temperature and in the consistency of cervical mucus. Post-ovulation method In this method, a couple waits to have sex until after ovulation. Where to find more information  Centers for Disease Control and Prevention: www.cdc.gov Summary  Contraception, also called birth control, refers to methods or  devices that prevent pregnancy.  Hormonal methods of contraception include implants, injections, pills, patches, vaginal rings, and emergency contraceptives.  Barrier methods of contraception can include female condoms, female condoms, diaphragms, cervical caps, sponges, and spermicides.  There are two types of IUDs (intrauterine devices). An IUD can be put in a woman's uterus to prevent pregnancy for 3-5 years.  Permanent sterilization can be done through a procedure for males and females. Natural family planning methods involve nothaving sex on days when the woman could become pregnant. This information is not intended to replace advice given to you by your health care provider. Make sure you discuss any questions you have with your health care provider. Document Revised: 06/09/2019 Document Reviewed: 06/09/2019 Elsevier Patient Education  2021 [employer] Inc.   Breastfeeding  Choosing to breastfeed is one of the best decisions you can make for yourself and your baby. A change in hormones during pregnancy causes your breasts to make breast milk in your milk-producing glands. Hormones prevent breast milk from being released before your baby is born. They also prompt milk flow after birth. Once breastfeeding has begun, thoughts of your baby, as well as his or her sucking or crying, can stimulate the release of milk from your milk-producing glands. Benefits of breastfeeding Research shows that breastfeeding offers many health benefits   for infants and mothers. It also offers a cost-free and convenient way to feed your baby. For your baby  Your first milk (colostrum) helps your baby's digestive system to function better.  Special cells in your milk (antibodies) help your baby to fight off infections.  Breastfed babies are less likely to develop asthma, allergies, obesity, or type 2 diabetes. They are also at lower risk for sudden infant death syndrome (SIDS).  Nutrients in breast milk are better  able to meet your baby's needs compared to infant formula.  Breast milk improves your baby's brain development. For you  Breastfeeding helps to create a very special bond between you and your baby.  Breastfeeding is convenient. Breast milk costs nothing and is always available at the correct temperature.  Breastfeeding helps to burn calories. It helps you to lose the weight that you gained during pregnancy.  Breastfeeding makes your uterus return faster to its size before pregnancy. It also slows bleeding (lochia) after you give birth.  Breastfeeding helps to lower your risk of developing type 2 diabetes, osteoporosis, rheumatoid arthritis, cardiovascular disease, and breast, ovarian, uterine, and endometrial cancer later in life. Breastfeeding basics Starting breastfeeding  Find a comfortable place to sit or lie down, with your neck and back well-supported.  Place a pillow or a rolled-up blanket under your baby to bring him or her to the level of your breast (if you are seated). Nursing pillows are specially designed to help support your arms and your baby while you breastfeed.  Make sure that your baby's tummy (abdomen) is facing your abdomen.  Gently massage your breast. With your fingertips, massage from the outer edges of your breast inward toward the nipple. This encourages milk flow. If your milk flows slowly, you may need to continue this action during the feeding.  Support your breast with 4 fingers underneath and your thumb above your nipple (make the letter "C" with your hand). Make sure your fingers are well away from your nipple and your baby's mouth.  Stroke your baby's lips gently with your finger or nipple.  When your baby's mouth is open wide enough, quickly bring your baby to your breast, placing your entire nipple and as much of the areola as possible into your baby's mouth. The areola is the colored area around your nipple. ? More areola should be visible above your  baby's upper lip than below the lower lip. ? Your baby's lips should be opened and extended outward (flanged) to ensure an adequate, comfortable latch. ? Your baby's tongue should be between his or her lower gum and your breast.  Make sure that your baby's mouth is correctly positioned around your nipple (latched). Your baby's lips should create a seal on your breast and be turned out (everted).  It is common for your baby to suck about 2-3 minutes in order to start the flow of breast milk. Latching Teaching your baby how to latch onto your breast properly is very important. An improper latch can cause nipple pain, decreased milk supply, and poor weight gain in your baby. Also, if your baby is not latched onto your nipple properly, he or she may swallow some air during feeding. This can make your baby fussy. Burping your baby when you switch breasts during the feeding can help to get rid of the air. However, teaching your baby to latch on properly is still the best way to prevent fussiness from swallowing air while breastfeeding. Signs that your baby has successfully latched onto   your nipple  Silent tugging or silent sucking, without causing you pain. Infant's lips should be extended outward (flanged).  Swallowing heard between every 3-4 sucks once your milk has started to flow (after your let-down milk reflex occurs).  Muscle movement above and in front of his or her ears while sucking. Signs that your baby has not successfully latched onto your nipple  Sucking sounds or smacking sounds from your baby while breastfeeding.  Nipple pain. If you think your baby has not latched on correctly, slip your finger into the corner of your baby's mouth to break the suction and place it between your baby's gums. Attempt to start breastfeeding again. Signs of successful breastfeeding Signs from your baby  Your baby will gradually decrease the number of sucks or will completely stop sucking.  Your baby  will fall asleep.  Your baby's body will relax.  Your baby will retain a small amount of milk in his or her mouth.  Your baby will let go of your breast by himself or herself. Signs from you  Breasts that have increased in firmness, weight, and size 1-3 hours after feeding.  Breasts that are softer immediately after breastfeeding.  Increased milk volume, as well as a change in milk consistency and color by the fifth day of breastfeeding.  Nipples that are not sore, cracked, or bleeding. Signs that your baby is getting enough milk  Wetting at least 1-2 diapers during the first 24 hours after birth.  Wetting at least 5-6 diapers every 24 hours for the first week after birth. The urine should be clear or pale yellow by the age of 5 days.  Wetting 6-8 diapers every 24 hours as your baby continues to grow and develop.  At least 3 stools in a 24-hour period by the age of 5 days. The stool should be soft and yellow.  At least 3 stools in a 24-hour period by the age of 7 days. The stool should be seedy and yellow.  No loss of weight greater than 10% of birth weight during the first 3 days of life.  Average weight gain of 4-7 oz (113-198 g) per week after the age of 4 days.  Consistent daily weight gain by the age of 5 days, without weight loss after the age of 2 weeks. After a feeding, your baby may spit up a small amount of milk. This is normal. Breastfeeding frequency and duration Frequent feeding will help you make more milk and can prevent sore nipples and extremely full breasts (breast engorgement). Breastfeed when you feel the need to reduce the fullness of your breasts or when your baby shows signs of hunger. This is called "breastfeeding on demand." Signs that your baby is hungry include:  Increased alertness, activity, or restlessness.  Movement of the head from side to side.  Opening of the mouth when the corner of the mouth or cheek is stroked (rooting).  Increased  sucking sounds, smacking lips, cooing, sighing, or squeaking.  Hand-to-mouth movements and sucking on fingers or hands.  Fussing or crying. Avoid introducing a pacifier to your baby in the first 4-6 weeks after your baby is born. After this time, you may choose to use a pacifier. Research has shown that pacifier use during the first year of a baby's life decreases the risk of sudden infant death syndrome (SIDS). Allow your baby to feed on each breast as long as he or she wants. When your baby unlatches or falls asleep while feeding from the   first breast, offer the second breast. Because newborns are often sleepy in the first few weeks of life, you may need to awaken your baby to get him or her to feed. Breastfeeding times will vary from baby to baby. However, the following rules can serve as a guide to help you make sure that your baby is properly fed:  Newborns (babies 4 weeks of age or younger) may breastfeed every 1-3 hours.  Newborns should not go without breastfeeding for longer than 3 hours during the day or 5 hours during the night.  You should breastfeed your baby a minimum of 8 times in a 24-hour period. Breast milk pumping Pumping and storing breast milk allows you to make sure that your baby is exclusively fed your breast milk, even at times when you are unable to breastfeed. This is especially important if you go back to work while you are still breastfeeding, or if you are not able to be present during feedings. Your lactation consultant can help you find a method of pumping that works best for you and give you guidelines about how long it is safe to store breast milk.      Caring for your breasts while you breastfeed Nipples can become dry, cracked, and sore while breastfeeding. The following recommendations can help keep your breasts moisturized and healthy:  Avoid using soap on your nipples.  Wear a supportive bra designed especially for nursing. Avoid wearing underwire-style  bras or extremely tight bras (sports bras).  Air-dry your nipples for 3-4 minutes after each feeding.  Use only cotton bra pads to absorb leaked breast milk. Leaking of breast milk between feedings is normal.  Use lanolin on your nipples after breastfeeding. Lanolin helps to maintain your skin's normal moisture barrier. Pure lanolin is not harmful (not toxic) to your baby. You may also hand express a few drops of breast milk and gently massage that milk into your nipples and allow the milk to air-dry. In the first few weeks after giving birth, some women experience breast engorgement. Engorgement can make your breasts feel heavy, warm, and tender to the touch. Engorgement peaks within 3-5 days after you give birth. The following recommendations can help to ease engorgement:  Completely empty your breasts while breastfeeding or pumping. You may want to start by applying warm, moist heat (in the shower or with warm, water-soaked hand towels) just before feeding or pumping. This increases circulation and helps the milk flow. If your baby does not completely empty your breasts while breastfeeding, pump any extra milk after he or she is finished.  Apply ice packs to your breasts immediately after breastfeeding or pumping, unless this is too uncomfortable for you. To do this: ? Put ice in a plastic bag. ? Place a towel between your skin and the bag. ? Leave the ice on for 20 minutes, 2-3 times a day.  Make sure that your baby is latched on and positioned properly while breastfeeding. If engorgement persists after 48 hours of following these recommendations, contact your health care provider or a lactation consultant. Overall health care recommendations while breastfeeding  Eat 3 healthy meals and 3 snacks every day. Well-nourished mothers who are breastfeeding need an additional 450-500 calories a day. You can meet this requirement by increasing the amount of a balanced diet that you eat.  Drink  enough water to keep your urine pale yellow or clear.  Rest often, relax, and continue to take your prenatal vitamins to prevent fatigue, stress, and low   vitamin and mineral levels in your body (nutrient deficiencies).  Do not use any products that contain nicotine or tobacco, such as cigarettes and e-cigarettes. Your baby may be harmed by chemicals from cigarettes that pass into breast milk and exposure to secondhand smoke. If you need help quitting, ask your health care provider.  Avoid alcohol.  Do not use illegal drugs or marijuana.  Talk with your health care provider before taking any medicines. These include over-the-counter and prescription medicines as well as vitamins and herbal supplements. Some medicines that may be harmful to your baby can pass through breast milk.  It is possible to become pregnant while breastfeeding. If birth control is desired, ask your health care provider about options that will be safe while breastfeeding your baby. Where to find more information: [employer]: www.llli.org Contact a health care provider if:  You feel like you want to stop breastfeeding or have become frustrated with breastfeeding.  Your nipples are cracked or bleeding.  Your breasts are red, tender, or warm.  You have: ? Painful breasts or nipples. ? A swollen area on either breast. ? A fever or chills. ? Nausea or vomiting. ? Drainage other than breast milk from your nipples.  Your breasts do not become full before feedings by the fifth day after you give birth.  You feel sad and depressed.  Your baby is: ? Too sleepy to eat well. ? Having trouble sleeping. ? More than 1 week old and wetting fewer than 6 diapers in a 24-hour period. ? Not gaining weight by 5 days of age.  Your baby has fewer than 3 stools in a 24-hour period.  Your baby's skin or the white parts of his or her eyes become yellow. Get help right away if:  Your baby is overly tired  (lethargic) and does not want to wake up and feed.  Your baby develops an unexplained fever. Summary  Breastfeeding offers many health benefits for infant and mothers.  Try to breastfeed your infant when he or she shows early signs of hunger.  Gently tickle or stroke your baby's lips with your finger or nipple to allow the baby to open his or her mouth. Bring the baby to your breast. Make sure that much of the areola is in your baby's mouth. Offer one side and burp the baby before you offer the other side.  Talk with your health care provider or lactation consultant if you have questions or you face problems as you breastfeed. This information is not intended to replace advice given to you by your health care provider. Make sure you discuss any questions you have with your health care provider. Document Revised: 03/29/2017 Document Reviewed: 02/04/2016 Elsevier Patient Education  2021 Else[employer] Inc.  

## 2020-06-15 ENCOUNTER — Encounter: Payer: Self-pay | Admitting: *Deleted

## 2020-06-15 ENCOUNTER — Other Ambulatory Visit: Payer: Self-pay | Admitting: Family Medicine

## 2020-06-15 ENCOUNTER — Ambulatory Visit (INDEPENDENT_AMBULATORY_CARE_PROVIDER_SITE_OTHER): Payer: Medicaid Other | Admitting: Student

## 2020-06-15 ENCOUNTER — Ambulatory Visit: Payer: Medicaid Other | Admitting: *Deleted

## 2020-06-15 ENCOUNTER — Other Ambulatory Visit: Payer: Self-pay

## 2020-06-15 ENCOUNTER — Ambulatory Visit: Payer: Medicaid Other | Attending: Allergy and Immunology

## 2020-06-15 VITALS — BP 119/87 | HR 97 | Wt 397.4 lb

## 2020-06-15 DIAGNOSIS — O34219 Maternal care for unspecified type scar from previous cesarean delivery: Secondary | ICD-10-CM

## 2020-06-15 DIAGNOSIS — O0990 Supervision of high risk pregnancy, unspecified, unspecified trimester: Secondary | ICD-10-CM | POA: Insufficient documentation

## 2020-06-15 DIAGNOSIS — O09213 Supervision of pregnancy with history of pre-term labor, third trimester: Secondary | ICD-10-CM

## 2020-06-15 DIAGNOSIS — Z3A35 35 weeks gestation of pregnancy: Secondary | ICD-10-CM

## 2020-06-15 DIAGNOSIS — O09523 Supervision of elderly multigravida, third trimester: Secondary | ICD-10-CM | POA: Insufficient documentation

## 2020-06-15 DIAGNOSIS — O10919 Unspecified pre-existing hypertension complicating pregnancy, unspecified trimester: Secondary | ICD-10-CM | POA: Diagnosis present

## 2020-06-15 DIAGNOSIS — Z98891 History of uterine scar from previous surgery: Secondary | ICD-10-CM

## 2020-06-15 DIAGNOSIS — O99213 Obesity complicating pregnancy, third trimester: Secondary | ICD-10-CM | POA: Diagnosis not present

## 2020-06-15 DIAGNOSIS — O99843 Bariatric surgery status complicating pregnancy, third trimester: Secondary | ICD-10-CM

## 2020-06-15 DIAGNOSIS — O283 Abnormal ultrasonic finding on antenatal screening of mother: Secondary | ICD-10-CM

## 2020-06-15 DIAGNOSIS — Z87898 Personal history of other specified conditions: Secondary | ICD-10-CM | POA: Diagnosis present

## 2020-06-15 NOTE — Procedures (Signed)
Danielle [name] [birth year] [redacted]w[redacted]d  Fetus A Non-Stress Test Interpretation for 06/15/20  Indication: No breathing on ultrasound  Fetal Heart Rate A Mode: External Baseline Rate (A): 145 bpm Variability: Moderate Accelerations: 15 x 15 Decelerations: None Multiple birth?: No  Uterine Activity Mode: Palpation,Toco Contraction Frequency (min): none Resting Tone Palpated: Relaxed Resting Time: Adequate  Interpretation (Fetal Testing) Nonstress Test Interpretation: Reactive Overall Impression: Reassuring for gestational age Comments: Dr. Parke Poisson reviewed tracing.

## 2020-06-15 NOTE — Patient Instructions (Signed)
Cesarean Delivery Cesarean birth, or cesarean delivery, is the surgical delivery of a baby through an incision in the abdomen and the uterus. This may be referred to as a C-section. This procedure may be scheduled ahead of time, or it may be done in an emergency situation. Tell a health care provider about:  Any allergies you have.  All medicines you are taking, including vitamins, herbs, eye drops, creams, and over-the-counter medicines.  Any problems you or family members have had with anesthetic medicines.  Any blood disorders you have.  Any surgeries you have had.  Any medical conditions you have.  Whether you or any members of your family have a history of deep vein thrombosis (DVT) or pulmonary embolism (PE). What are the risks? Generally, this is a safe procedure. However, problems may occur, including:  Infection.  Bleeding.  Allergic reactions to medicines.  Damage to other structures or organs.  Blood clots.  Injury to your baby. What happens before the procedure? General instructions  Follow instructions from your health care provider about eating or drinking restrictions.  If you know that you are going to have a cesarean delivery, do not shave your pubic area. Shaving before the procedure may increase your risk of infection.  Plan to have someone take you home from the hospital.  Ask your health care provider what steps will be taken to prevent infection. These may include: ? Removing hair at the surgery site. ? Washing skin with a germ-killing soap. ? Taking antibiotic medicine.  Depending on the reason for your cesarean delivery, you may have a physical exam or additional testing, such as an ultrasound.  You may have your blood or urine tested. Questions for your health care provider  Ask your health care provider about: ? Changing or stopping your regular medicines. This is especially important if you are taking diabetes medicines or blood  thinners. ? Your pain management plan. This is especially important if you plan to breastfeed your baby. ? How long you will be in the hospital after the procedure. ? Any concerns you may have about receiving blood products, if you need them during the procedure. ? Cord blood banking, if you plan to collect your baby's umbilical cord blood.  You may also want to ask your health care provider: ? Whether you will be able to hold or breastfeed your baby while you are still in the operating room. ? Whether your baby can stay with you immediately after the procedure and during your recovery. ? Whether a family member or a person of your choice can go with you into the operating room and stay with you during the procedure, immediately after the procedure, and during your recovery. What happens during the procedure?  An IV will be inserted into one of your veins.  Fluid and medicines, such as antibiotics, will be given before the surgery.  Fetal monitors will be placed on your abdomen to check your baby's heart rate.  You may be given a special warming gown to wear to keep your temperature stable.  A catheter may be inserted into your bladder through your urethra. This drains your urine during the procedure.  You may be given one or more of the following: ? A medicine to numb the area (local anesthetic). ? A medicine to make you fall asleep (general anesthetic). ? A medicine (regional anesthetic) that is injected into your back or through a small thin tube placed in your back (spinal anesthetic or epidural anesthetic). This   numbs everything below the injection site and allows you to stay awake during your procedure. If this makes you feel nauseous, tell your health care provider. Medicines will be available to help reduce any nausea you may feel.  An incision will be made in your abdomen, and then in your uterus.  If you are awake during your procedure, you may feel tugging and pulling in your  abdomen, but you should not feel pain. If you feel pain, tell your health care provider immediately.  Your baby will be removed from your uterus. You may feel more pressure or pushing while this happens.  Immediately after birth, your baby will be dried and kept warm. You may be able to hold and breastfeed your baby.  The umbilical cord may be clamped and cut during this time. This usually occurs after waiting a period of 1-2 minutes after delivery.  Your placenta will be removed from your uterus.  Your incisions will be closed with stitches (sutures). Staples, skin glue, or adhesive strips may also be applied to the incision in your abdomen.  Bandages (dressings) may be placed over the incision in your abdomen. The procedure may vary among health care providers and hospitals.   What happens after the procedure?  Your blood pressure, heart rate, breathing rate, and blood oxygen level will be monitored until you are discharged from the hospital.  You may continue to receive fluids and medicines through an IV.  You will have some pain. Medicines will be available to help control your pain.  To help prevent blood clots: ? You may be given medicines. ? You may have to wear compression stockings or devices. ? You will be encouraged to walk around when you are able.  Hospital staff will encourage and support bonding with your baby. Your hospital may have you and your baby to stay in the same room (rooming in) during your hospital stay to encourage successful bonding and breastfeeding.  You may be encouraged to cough and breathe deeply often. This helps to prevent lung problems.  If you have a catheter draining your urine, it will be removed as soon as possible after your procedure. Summary  Cesarean birth, or cesarean delivery, is the surgical delivery of a baby through an incision in the abdomen and the uterus.  Follow instructions from your health care provider about eating or  drinking restrictions before the procedure.  You will have some pain after the procedure. Medicines will be available to help control your pain.  Hospital staff will encourage and support bonding with your baby after the procedure. Your hospital may have you and your baby to stay in the same room (rooming in) during your hospital stay to encourage successful bonding and breastfeeding. This information is not intended to replace advice given to you by your health care provider. Make sure you discuss any questions you have with your health care provider. Document Revised: 09/01/2019 Document Reviewed: 07/09/2017 Elsevier Patient Education  2021 [employer]   

## 2020-06-15 NOTE — Progress Notes (Signed)
   PRENATAL VISIT NOTE  Subjective:  Danielle [name] is a 35 y.o. [id number] at [redacted]w[redacted]d being seen today for ongoing prenatal care.  She is currently monitored for the following issues for this high-risk pregnancy and has Rh negative state in antepartum period; Anemia; Hirsutism; Supervision of high risk pregnancy, antepartum; Advanced maternal age in multigravida; History of 2 cesarean sections; History of poor fetal growth; History of preterm delivery; Alpha-thalassemia (HCC); COVID-19 vaccine administered; Chronic hypertension during pregnancy; and Alpha thalassemia silent carrier on their problem list.  Patient reports no complaints.  Contractions: Irritability. Vag. Bleeding: None.  Movement: Present. Denies leaking of fluid.   The following portions of the patient's history were reviewed and updated as appropriate: allergies, current medications, past family history, past medical history, past social history, past surgical history and problem list.   Objective:   Vitals:   06/15/20 0947  BP: 119/87  Pulse: 97  Weight: (!) 397 lb 6.4 oz (180.3 kg)    Fetal Status: Fetal Heart Rate (bpm): 142   Movement: Present  Presentation: Vertex  General:  Alert, oriented and cooperative. Patient is in no acute distress.  Skin: Skin is warm and dry. No rash noted.   Cardiovascular: Normal heart rate noted  Respiratory: Normal respiratory effort, no problems with respiration noted  Abdomen: Soft, gravid, appropriate for gestational age.  Pain/Pressure: Present     Pelvic: Cervical exam performed in the presence of a chaperone Dilation: Closed Effacement (%): 50 Station: -3  Extremities: Normal range of motion.  Edema: Trace  Mental Status: Normal mood and affect. Normal behavior. Normal judgment and thought content.   Assessment and Plan:  Pregnancy: [name] at [redacted]w[redacted]d 1. Supervision of high risk pregnancy, antepartum -has appt for BPP & growth today  2. Chronic hypertension during pregnancy -BP  stable today. Not on meds  3. History of 2 cesarean sections -scheduled for repeat c/section  4. [redacted] weeks gestation of pregnancy   Term labor symptoms and general obstetric precautions including but not limited to vaginal bleeding, contractions, leaking of fluid and fetal movement were reviewed in detail with the patient. Please refer to After Visit Summary for other counseling recommendations.   Return in about 1 week (around 06/22/2020) for High Risk OB.  Future Appointments  Date Time Provider Department Center  06/22/2020  8:55 AM Venora Maples, MD [facility]  06/23/2020  1:15 PM [facility] NST [facility]  06/29/2020  9:55 AM Venora Maples, MD [facility]  06/30/2020 11:15 AM [facility] NST [facility]  07/06/2020  9:55 AM Adam Phenix, MD [facility]  07/07/2020 11:15 AM [facility] NST [facility]    Judeth Horn, NP

## 2020-06-15 NOTE — Addendum Note (Signed)
Addended by: Guy Begin on: 06/15/2020 11:49 AM   Modules accepted: Orders

## 2020-06-16 ENCOUNTER — Ambulatory Visit: Payer: Medicaid Other

## 2020-06-16 ENCOUNTER — Ambulatory Visit: Payer: Self-pay

## 2020-06-19 LAB — CULTURE, BETA STREP (GROUP B ONLY): Strep Gp B Culture: NEGATIVE

## 2020-06-22 ENCOUNTER — Ambulatory Visit: Payer: Medicaid Other

## 2020-06-22 ENCOUNTER — Other Ambulatory Visit (HOSPITAL_COMMUNITY)
Admission: RE | Admit: 2020-06-22 | Discharge: 2020-06-22 | Disposition: A | Discharge status: Home / Self Care | Payer: Medicaid Other | Source: Ambulatory Visit | Attending: Family Medicine | Admitting: Family Medicine

## 2020-06-22 ENCOUNTER — Encounter: Payer: Self-pay | Admitting: Family Medicine

## 2020-06-22 ENCOUNTER — Other Ambulatory Visit: Payer: Self-pay

## 2020-06-22 ENCOUNTER — Ambulatory Visit (INDEPENDENT_AMBULATORY_CARE_PROVIDER_SITE_OTHER): Payer: Medicaid Other | Admitting: Family Medicine

## 2020-06-22 VITALS — BP 130/80 | HR 96 | Wt 397.0 lb

## 2020-06-22 DIAGNOSIS — Z8751 Personal history of pre-term labor: Secondary | ICD-10-CM

## 2020-06-22 DIAGNOSIS — Z98891 History of uterine scar from previous surgery: Secondary | ICD-10-CM

## 2020-06-22 DIAGNOSIS — O10919 Unspecified pre-existing hypertension complicating pregnancy, unspecified trimester: Secondary | ICD-10-CM

## 2020-06-22 DIAGNOSIS — O0990 Supervision of high risk pregnancy, unspecified, unspecified trimester: Secondary | ICD-10-CM | POA: Diagnosis present

## 2020-06-22 DIAGNOSIS — Z6791 Unspecified blood type, Rh negative: Secondary | ICD-10-CM

## 2020-06-22 DIAGNOSIS — O26899 Other specified pregnancy related conditions, unspecified trimester: Secondary | ICD-10-CM

## 2020-06-22 DIAGNOSIS — O09523 Supervision of elderly multigravida, third trimester: Secondary | ICD-10-CM

## 2020-06-22 NOTE — Patient Instructions (Signed)
 Contraception Choices Contraception, also called birth control, refers to methods or devices that prevent pregnancy. Hormonal methods Contraceptive implant A contraceptive implant is a thin, plastic tube that contains a hormone that prevents pregnancy. It is different from an intrauterine device (IUD). It is inserted into the upper part of the arm by a health care provider. Implants can be effective for up to 3 years. Progestin-only injections Progestin-only injections are injections of progestin, a synthetic form of the hormone progesterone. They are given every 3 months by a health care provider. Birth control pills Birth control pills are pills that contain hormones that prevent pregnancy. They must be taken once a day, preferably at the same time each day. A prescription is needed to use this method of contraception. Birth control patch The birth control patch contains hormones that prevent pregnancy. It is placed on the skin and must be changed once a week for three weeks and removed on the fourth week. A prescription is needed to use this method of contraception. Vaginal ring A vaginal ring contains hormones that prevent pregnancy. It is placed in the vagina for three weeks and removed on the fourth week. After that, the process is repeated with a new ring. A prescription is needed to use this method of contraception. Emergency contraceptive Emergency contraceptives prevent pregnancy after unprotected sex. They come in pill form and can be taken up to 5 days after sex. They work best the sooner they are taken after having sex. Most emergency contraceptives are available without a prescription. This method should not be used as your only form of birth control.   Barrier methods Female condom A female condom is a thin sheath that is worn over the penis during sex. Condoms keep sperm from going inside a woman's body. They can be used with a sperm-killing substance (spermicide) to increase their  effectiveness. They should be thrown away after one use. Female condom A female condom is a soft, loose-fitting sheath that is put into the vagina before sex. The condom keeps sperm from going inside a woman's body. They should be thrown away after one use. Diaphragm A diaphragm is a soft, dome-shaped barrier. It is inserted into the vagina before sex, along with a spermicide. The diaphragm blocks sperm from entering the uterus, and the spermicide kills sperm. A diaphragm should be left in the vagina for 6-8 hours after sex and removed within 24 hours. A diaphragm is prescribed and fitted by a health care provider. A diaphragm should be replaced every 1-2 years, after giving birth, after gaining more than 15 lb (6.8 kg), and after pelvic surgery. Cervical cap A cervical cap is a round, soft latex or plastic cup that fits over the cervix. It is inserted into the vagina before sex, along with spermicide. It blocks sperm from entering the uterus. The cap should be left in place for 6-8 hours after sex and removed within 48 hours. A cervical cap must be prescribed and fitted by a health care provider. It should be replaced every 2 years. Sponge A sponge is a soft, circular piece of polyurethane foam with spermicide in it. The sponge helps block sperm from entering the uterus, and the spermicide kills sperm. To use it, you make it wet and then insert it into the vagina. It should be inserted before sex, left in for at least 6 hours after sex, and removed and thrown away within 30 hours. Spermicides Spermicides are chemicals that kill or block sperm from entering the   cervix and uterus. They can come as a cream, jelly, suppository, foam, or tablet. A spermicide should be inserted into the vagina with an applicator at least 10-15 minutes before sex to allow time for it to work. The process must be repeated every time you have sex. Spermicides do not require a prescription.   Intrauterine  contraception Intrauterine device (IUD) An IUD is a T-shaped device that is put in a woman's uterus. There are two types:  Hormone IUD.This type contains progestin, a synthetic form of the hormone progesterone. This type can stay in place for 3-5 years.  Copper IUD.This type is wrapped in copper wire. It can stay in place for 10 years. Permanent methods of contraception Female tubal ligation In this method, a woman's fallopian tubes are sealed, tied, or blocked during surgery to prevent eggs from traveling to the uterus. Hysteroscopic sterilization In this method, a small, flexible insert is placed into each fallopian tube. The inserts cause scar tissue to form in the fallopian tubes and block them, so sperm cannot reach an egg. The procedure takes about 3 months to be effective. Another form of birth control must be used during those 3 months. Female sterilization This is a procedure to tie off the tubes that carry sperm (vasectomy). After the procedure, the man can still ejaculate fluid (semen). Another form of birth control must be used for 3 months after the procedure. Natural planning methods Natural family planning In this method, a couple does not have sex on days when the woman could become pregnant. Calendar method In this method, the woman keeps track of the length of each menstrual cycle, identifies the days when pregnancy can happen, and does not have sex on those days. Ovulation method In this method, a couple avoids sex during ovulation. Symptothermal method This method involves not having sex during ovulation. The woman typically checks for ovulation by watching changes in her temperature and in the consistency of cervical mucus. Post-ovulation method In this method, a couple waits to have sex until after ovulation. Where to find more information  Centers for Disease Control and Prevention: www.cdc.gov Summary  Contraception, also called birth control, refers to methods or  devices that prevent pregnancy.  Hormonal methods of contraception include implants, injections, pills, patches, vaginal rings, and emergency contraceptives.  Barrier methods of contraception can include female condoms, female condoms, diaphragms, cervical caps, sponges, and spermicides.  There are two types of IUDs (intrauterine devices). An IUD can be put in a woman's uterus to prevent pregnancy for 3-5 years.  Permanent sterilization can be done through a procedure for males and females. Natural family planning methods involve nothaving sex on days when the woman could become pregnant. This information is not intended to replace advice given to you by your health care provider. Make sure you discuss any questions you have with your health care provider. Document Revised: 06/09/2019 Document Reviewed: 06/09/2019 Elsevier Patient Education  2021 [employer] Inc.   Breastfeeding  Choosing to breastfeed is one of the best decisions you can make for yourself and your baby. A change in hormones during pregnancy causes your breasts to make breast milk in your milk-producing glands. Hormones prevent breast milk from being released before your baby is born. They also prompt milk flow after birth. Once breastfeeding has begun, thoughts of your baby, as well as his or her sucking or crying, can stimulate the release of milk from your milk-producing glands. Benefits of breastfeeding Research shows that breastfeeding offers many health benefits   for infants and mothers. It also offers a cost-free and convenient way to feed your baby. For your baby  Your first milk (colostrum) helps your baby's digestive system to function better.  Special cells in your milk (antibodies) help your baby to fight off infections.  Breastfed babies are less likely to develop asthma, allergies, obesity, or type 2 diabetes. They are also at lower risk for sudden infant death syndrome (SIDS).  Nutrients in breast milk are better  able to meet your baby's needs compared to infant formula.  Breast milk improves your baby's brain development. For you  Breastfeeding helps to create a very special bond between you and your baby.  Breastfeeding is convenient. Breast milk costs nothing and is always available at the correct temperature.  Breastfeeding helps to burn calories. It helps you to lose the weight that you gained during pregnancy.  Breastfeeding makes your uterus return faster to its size before pregnancy. It also slows bleeding (lochia) after you give birth.  Breastfeeding helps to lower your risk of developing type 2 diabetes, osteoporosis, rheumatoid arthritis, cardiovascular disease, and breast, ovarian, uterine, and endometrial cancer later in life. Breastfeeding basics Starting breastfeeding  Find a comfortable place to sit or lie down, with your neck and back well-supported.  Place a pillow or a rolled-up blanket under your baby to bring him or her to the level of your breast (if you are seated). Nursing pillows are specially designed to help support your arms and your baby while you breastfeed.  Make sure that your baby's tummy (abdomen) is facing your abdomen.  Gently massage your breast. With your fingertips, massage from the outer edges of your breast inward toward the nipple. This encourages milk flow. If your milk flows slowly, you may need to continue this action during the feeding.  Support your breast with 4 fingers underneath and your thumb above your nipple (make the letter "C" with your hand). Make sure your fingers are well away from your nipple and your baby's mouth.  Stroke your baby's lips gently with your finger or nipple.  When your baby's mouth is open wide enough, quickly bring your baby to your breast, placing your entire nipple and as much of the areola as possible into your baby's mouth. The areola is the colored area around your nipple. ? More areola should be visible above your  baby's upper lip than below the lower lip. ? Your baby's lips should be opened and extended outward (flanged) to ensure an adequate, comfortable latch. ? Your baby's tongue should be between his or her lower gum and your breast.  Make sure that your baby's mouth is correctly positioned around your nipple (latched). Your baby's lips should create a seal on your breast and be turned out (everted).  It is common for your baby to suck about 2-3 minutes in order to start the flow of breast milk. Latching Teaching your baby how to latch onto your breast properly is very important. An improper latch can cause nipple pain, decreased milk supply, and poor weight gain in your baby. Also, if your baby is not latched onto your nipple properly, he or she may swallow some air during feeding. This can make your baby fussy. Burping your baby when you switch breasts during the feeding can help to get rid of the air. However, teaching your baby to latch on properly is still the best way to prevent fussiness from swallowing air while breastfeeding. Signs that your baby has successfully latched onto   your nipple  Silent tugging or silent sucking, without causing you pain. Infant's lips should be extended outward (flanged).  Swallowing heard between every 3-4 sucks once your milk has started to flow (after your let-down milk reflex occurs).  Muscle movement above and in front of his or her ears while sucking. Signs that your baby has not successfully latched onto your nipple  Sucking sounds or smacking sounds from your baby while breastfeeding.  Nipple pain. If you think your baby has not latched on correctly, slip your finger into the corner of your baby's mouth to break the suction and place it between your baby's gums. Attempt to start breastfeeding again. Signs of successful breastfeeding Signs from your baby  Your baby will gradually decrease the number of sucks or will completely stop sucking.  Your baby  will fall asleep.  Your baby's body will relax.  Your baby will retain a small amount of milk in his or her mouth.  Your baby will let go of your breast by himself or herself. Signs from you  Breasts that have increased in firmness, weight, and size 1-3 hours after feeding.  Breasts that are softer immediately after breastfeeding.  Increased milk volume, as well as a change in milk consistency and color by the fifth day of breastfeeding.  Nipples that are not sore, cracked, or bleeding. Signs that your baby is getting enough milk  Wetting at least 1-2 diapers during the first 24 hours after birth.  Wetting at least 5-6 diapers every 24 hours for the first week after birth. The urine should be clear or pale yellow by the age of 5 days.  Wetting 6-8 diapers every 24 hours as your baby continues to grow and develop.  At least 3 stools in a 24-hour period by the age of 5 days. The stool should be soft and yellow.  At least 3 stools in a 24-hour period by the age of 7 days. The stool should be seedy and yellow.  No loss of weight greater than 10% of birth weight during the first 3 days of life.  Average weight gain of 4-7 oz (113-198 g) per week after the age of 4 days.  Consistent daily weight gain by the age of 5 days, without weight loss after the age of 2 weeks. After a feeding, your baby may spit up a small amount of milk. This is normal. Breastfeeding frequency and duration Frequent feeding will help you make more milk and can prevent sore nipples and extremely full breasts (breast engorgement). Breastfeed when you feel the need to reduce the fullness of your breasts or when your baby shows signs of hunger. This is called "breastfeeding on demand." Signs that your baby is hungry include:  Increased alertness, activity, or restlessness.  Movement of the head from side to side.  Opening of the mouth when the corner of the mouth or cheek is stroked (rooting).  Increased  sucking sounds, smacking lips, cooing, sighing, or squeaking.  Hand-to-mouth movements and sucking on fingers or hands.  Fussing or crying. Avoid introducing a pacifier to your baby in the first 4-6 weeks after your baby is born. After this time, you may choose to use a pacifier. Research has shown that pacifier use during the first year of a baby's life decreases the risk of sudden infant death syndrome (SIDS). Allow your baby to feed on each breast as long as he or she wants. When your baby unlatches or falls asleep while feeding from the   first breast, offer the second breast. Because newborns are often sleepy in the first few weeks of life, you may need to awaken your baby to get him or her to feed. Breastfeeding times will vary from baby to baby. However, the following rules can serve as a guide to help you make sure that your baby is properly fed:  Newborns (babies 4 weeks of age or younger) may breastfeed every 1-3 hours.  Newborns should not go without breastfeeding for longer than 3 hours during the day or 5 hours during the night.  You should breastfeed your baby a minimum of 8 times in a 24-hour period. Breast milk pumping Pumping and storing breast milk allows you to make sure that your baby is exclusively fed your breast milk, even at times when you are unable to breastfeed. This is especially important if you go back to work while you are still breastfeeding, or if you are not able to be present during feedings. Your lactation consultant can help you find a method of pumping that works best for you and give you guidelines about how long it is safe to store breast milk.      Caring for your breasts while you breastfeed Nipples can become dry, cracked, and sore while breastfeeding. The following recommendations can help keep your breasts moisturized and healthy:  Avoid using soap on your nipples.  Wear a supportive bra designed especially for nursing. Avoid wearing underwire-style  bras or extremely tight bras (sports bras).  Air-dry your nipples for 3-4 minutes after each feeding.  Use only cotton bra pads to absorb leaked breast milk. Leaking of breast milk between feedings is normal.  Use lanolin on your nipples after breastfeeding. Lanolin helps to maintain your skin's normal moisture barrier. Pure lanolin is not harmful (not toxic) to your baby. You may also hand express a few drops of breast milk and gently massage that milk into your nipples and allow the milk to air-dry. In the first few weeks after giving birth, some women experience breast engorgement. Engorgement can make your breasts feel heavy, warm, and tender to the touch. Engorgement peaks within 3-5 days after you give birth. The following recommendations can help to ease engorgement:  Completely empty your breasts while breastfeeding or pumping. You may want to start by applying warm, moist heat (in the shower or with warm, water-soaked hand towels) just before feeding or pumping. This increases circulation and helps the milk flow. If your baby does not completely empty your breasts while breastfeeding, pump any extra milk after he or she is finished.  Apply ice packs to your breasts immediately after breastfeeding or pumping, unless this is too uncomfortable for you. To do this: ? Put ice in a plastic bag. ? Place a towel between your skin and the bag. ? Leave the ice on for 20 minutes, 2-3 times a day.  Make sure that your baby is latched on and positioned properly while breastfeeding. If engorgement persists after 48 hours of following these recommendations, contact your health care provider or a lactation consultant. Overall health care recommendations while breastfeeding  Eat 3 healthy meals and 3 snacks every day. Well-nourished mothers who are breastfeeding need an additional 450-500 calories a day. You can meet this requirement by increasing the amount of a balanced diet that you eat.  Drink  enough water to keep your urine pale yellow or clear.  Rest often, relax, and continue to take your prenatal vitamins to prevent fatigue, stress, and low   vitamin and mineral levels in your body (nutrient deficiencies).  Do not use any products that contain nicotine or tobacco, such as cigarettes and e-cigarettes. Your baby may be harmed by chemicals from cigarettes that pass into breast milk and exposure to secondhand smoke. If you need help quitting, ask your health care provider.  Avoid alcohol.  Do not use illegal drugs or marijuana.  Talk with your health care provider before taking any medicines. These include over-the-counter and prescription medicines as well as vitamins and herbal supplements. Some medicines that may be harmful to your baby can pass through breast milk.  It is possible to become pregnant while breastfeeding. If birth control is desired, ask your health care provider about options that will be safe while breastfeeding your baby. Where to find more information: [employer]: www.llli.org Contact a health care provider if:  You feel like you want to stop breastfeeding or have become frustrated with breastfeeding.  Your nipples are cracked or bleeding.  Your breasts are red, tender, or warm.  You have: ? Painful breasts or nipples. ? A swollen area on either breast. ? A fever or chills. ? Nausea or vomiting. ? Drainage other than breast milk from your nipples.  Your breasts do not become full before feedings by the fifth day after you give birth.  You feel sad and depressed.  Your baby is: ? Too sleepy to eat well. ? Having trouble sleeping. ? More than 1 week old and wetting fewer than 6 diapers in a 24-hour period. ? Not gaining weight by 5 days of age.  Your baby has fewer than 3 stools in a 24-hour period.  Your baby's skin or the white parts of his or her eyes become yellow. Get help right away if:  Your baby is overly tired  (lethargic) and does not want to wake up and feed.  Your baby develops an unexplained fever. Summary  Breastfeeding offers many health benefits for infant and mothers.  Try to breastfeed your infant when he or she shows early signs of hunger.  Gently tickle or stroke your baby's lips with your finger or nipple to allow the baby to open his or her mouth. Bring the baby to your breast. Make sure that much of the areola is in your baby's mouth. Offer one side and burp the baby before you offer the other side.  Talk with your health care provider or lactation consultant if you have questions or you face problems as you breastfeed. This information is not intended to replace advice given to you by your health care provider. Make sure you discuss any questions you have with your health care provider. Document Revised: 03/29/2017 Document Reviewed: 02/04/2016 Elsevier Patient Education  2021 Else[employer] Inc.  

## 2020-06-22 NOTE — Progress Notes (Signed)
   Subjective:  Danielle [name] is a 34 y.o. [id number] at [redacted]w[redacted]d being seen today for ongoing prenatal care.  She is currently monitored for the following issues for this high-risk pregnancy and has Rh negative state in antepartum period; Anemia; Hirsutism; Supervision of high risk pregnancy, antepartum; Advanced maternal age in multigravida; History of 2 cesarean sections; History of poor fetal growth; History of preterm delivery; Alpha-thalassemia (HCC); COVID-19 vaccine administered; Chronic hypertension during pregnancy; and Alpha thalassemia silent carrier on their problem list.  Patient reports pressure.  Contractions: Not present. Vag. Bleeding: None.  Movement: Present. Denies leaking of fluid.   The following portions of the patient's history were reviewed and updated as appropriate: allergies, current medications, past family history, past medical history, past social history, past surgical history and problem list. Problem list updated.  Objective:   Vitals:   06/22/20 0910  BP: 130/80  Pulse: 96  Weight: (!) 397 lb (180.1 kg)    Fetal Status: Fetal Heart Rate (bpm): 143   Movement: Present     General:  Alert, oriented and cooperative. Patient is in no acute distress.  Skin: Skin is warm and dry. No rash noted.   Cardiovascular: Normal heart rate noted  Respiratory: Normal respiratory effort, no problems with respiration noted  Abdomen: Soft, gravid, appropriate for gestational age. Pain/Pressure: Present     Pelvic: Vag. Bleeding: None     Cervical exam performed Dilation: Closed      Extremities: Normal range of motion.  Edema: Trace  Mental Status: Normal mood and affect. Normal behavior. Normal judgment and thought content.   Urinalysis:      Assessment and Plan:  Pregnancy: P7T0626 at [redacted]w[redacted]d  1. Supervision of high risk pregnancy, antepartum BP and FHR normal Patient would like her CS date moved up to 38 wks Her sister has to move her niece down to [place] due to  being newly stationed there for [employer] and she is the only family nearby who is available to watch their two children when she delivers Given that patient is cHTN and given child care issues reasonable to deliver at 38 weeks Dr. Charlotta Newton on that day, she is in agreement with this plan, message sent to reschedule  2. Chronic hypertension during pregnancy Well controlled on no meds BP normal today Following closely with MFM Has BPP scheduled for tomorrow  3. Rh negative state in antepartum period S/p rhogam  4. History of preterm delivery   5. History of 2 cesarean sections Scheduled for RCS+BTL, see above  6. Multigravida of advanced maternal age in third trimester   Preterm labor symptoms and general obstetric precautions including but not limited to vaginal bleeding, contractions, leaking of fluid and fetal movement were reviewed in detail with the patient. Please refer to After Visit Summary for other counseling recommendations.  Return in 1 week (on 06/29/2020) for [facility], ob visit.   Venora Maples, MD

## 2020-06-23 ENCOUNTER — Ambulatory Visit: Payer: Medicaid Other

## 2020-06-23 ENCOUNTER — Ambulatory Visit (INDEPENDENT_AMBULATORY_CARE_PROVIDER_SITE_OTHER): Payer: Medicaid Other

## 2020-06-23 ENCOUNTER — Ambulatory Visit: Payer: Medicaid Other | Admitting: *Deleted

## 2020-06-23 ENCOUNTER — Other Ambulatory Visit: Payer: Self-pay

## 2020-06-23 VITALS — BP 115/62 | HR 83 | Wt 395.7 lb

## 2020-06-23 DIAGNOSIS — O99213 Obesity complicating pregnancy, third trimester: Secondary | ICD-10-CM

## 2020-06-23 LAB — GC/CHLAMYDIA PROBE AMP (~~LOC~~) NOT AT ARMC
Chlamydia: NEGATIVE
Comment: NEGATIVE
Comment: NORMAL
Neisseria Gonorrhea: NEGATIVE

## 2020-06-23 NOTE — Progress Notes (Signed)

## 2020-06-25 NOTE — Progress Notes (Signed)
NST:  Baseline: 135 bpm, Variability: Good {> 6 bpm), Accelerations: Reactive, and Decelerations: Absent   

## 2020-06-25 NOTE — Progress Notes (Signed)
Patient seen and assessed by nursing staff.  Agree with documentation and plan.  

## 2020-06-28 ENCOUNTER — Encounter (HOSPITAL_COMMUNITY): Payer: Self-pay

## 2020-06-28 NOTE — Patient Instructions (Signed)
Danielle [name]  06/28/2020   Your procedure is scheduled on:  07/04/2020  Arrive at 0715 at Entrance C on [address] at [facility]  and [employer]. You are invited to use the FREE valet parking or use the Visitor's parking deck.  Pick up the phone at the desk and dial [phone].  Call this number if you have problems the morning of surgery: [phone]  Remember:   Do not eat food:(After Midnight) Desps de medianoche.  Do not drink clear liquids: (After Midnight) Desps de medianoche.  Take these medicines the morning of surgery with A SIP OF WATER:  none   Do not wear jewelry, make-up or nail polish.  Do not wear lotions, powders, or perfumes. Do not wear deodorant.  Do not shave 48 hours prior to surgery.  Do not bring valuables to the hospital.  [facility] is not   responsible for any belongings or valuables brought to the hospital.  Contacts, dentures or bridgework may not be worn into surgery.  Leave suitcase in the car. After surgery it may be brought to your room.  For patients admitted to the hospital, checkout time is 11:00 AM the day of              discharge.      Please read over the following fact sheets that you were given:     Preparing for Surgery

## 2020-06-29 ENCOUNTER — Ambulatory Visit: Payer: Medicaid Other

## 2020-06-29 ENCOUNTER — Ambulatory Visit (INDEPENDENT_AMBULATORY_CARE_PROVIDER_SITE_OTHER): Payer: Medicaid Other | Admitting: Family Medicine

## 2020-06-29 ENCOUNTER — Encounter: Payer: Self-pay | Admitting: Family Medicine

## 2020-06-29 ENCOUNTER — Other Ambulatory Visit: Payer: Self-pay

## 2020-06-29 VITALS — BP 119/81 | HR 103 | Wt 393.1 lb

## 2020-06-29 DIAGNOSIS — D649 Anemia, unspecified: Secondary | ICD-10-CM

## 2020-06-29 DIAGNOSIS — Z6791 Unspecified blood type, Rh negative: Secondary | ICD-10-CM

## 2020-06-29 DIAGNOSIS — O10919 Unspecified pre-existing hypertension complicating pregnancy, unspecified trimester: Secondary | ICD-10-CM

## 2020-06-29 DIAGNOSIS — O0990 Supervision of high risk pregnancy, unspecified, unspecified trimester: Secondary | ICD-10-CM

## 2020-06-29 DIAGNOSIS — Z98891 History of uterine scar from previous surgery: Secondary | ICD-10-CM

## 2020-06-29 DIAGNOSIS — Z8751 Personal history of pre-term labor: Secondary | ICD-10-CM

## 2020-06-29 DIAGNOSIS — Z6841 Body Mass Index (BMI) 40.0 and over, adult: Secondary | ICD-10-CM

## 2020-06-29 DIAGNOSIS — O26899 Other specified pregnancy related conditions, unspecified trimester: Secondary | ICD-10-CM

## 2020-06-29 DIAGNOSIS — O09523 Supervision of elderly multigravida, third trimester: Secondary | ICD-10-CM

## 2020-06-29 NOTE — Patient Instructions (Signed)

## 2020-06-29 NOTE — H&P (Signed)
Danielle [name] is an 35 y.o. [id number] [redacted]w[redacted]d female.   Chief Complaint: previous c-section x 2, undesired fertility HPI: Here for RCS at 38+ wks with [facility], Previous C-section x 2. Desires BTL.  Past Medical History:  Diagnosis Date   Degenerative arthritis of knee    left    Hx of alpha thalassemia     Past Surgical History:  Procedure Laterality Date   CESAREAN SECTION     LAPAROSCOPIC GASTRIC SLEEVE RESECTION      Family History  Problem Relation Age of Onset   Leukemia Mother    Renal Disease Mother    Diabetes Father    Hypertension Father    Prostate cancer Father    Dementia Father    Diabetes Maternal Grandmother    Hypertension Maternal Grandmother    Arthritis Maternal Grandfather    Social History:  reports that she has never smoked. She has never used smokeless tobacco. She reports that she does not drink alcohol and does not use drugs.    Allergies  Allergen Reactions   Nsaids     Gastric sleeve surgery- contraindicated    No medications prior to admission.     A comprehensive review of systems was negative.  Last menstrual period 10/11/2019. General appearance: alert, cooperative, and appears stated age Head: Normocephalic, without obvious abnormality, atraumatic Neck: supple, symmetrical, trachea midline Lungs:  normal effort Heart: regular rate and rhythm Abdomen: soft, non-tender; bowel sounds normal; no masses,  no organomegaly Extremities: Homans sign is negative, no sign of DVT Skin: Skin color, texture, turgor normal. No rashes or lesions Neurologic: Grossly normal   Lab Results  Component Value Date   WBC 8.8 04/20/2020   HGB 9.2 (L) 04/20/2020   HCT 29.9 (L) 04/20/2020   MCV 75 (L) 04/20/2020   PLT 278 04/20/2020           ABO, Rh: B/Negative/-- (11/30 1024)  Antibody: Negative (04/05 0857)  Rubella: 1.86 (11/30 1024)  RPR: Non Reactive (04/05 0857)  HBsAg: Negative (11/30 1024)  HIV: Non Reactive (04/05 0857)  GBS:  Negative/-- (05/31 1405)     Prenatal Transfer Tool  Maternal Diabetes: No Genetic Screening: Normal Maternal Ultrasounds/Referrals: Normal Fetal Ultrasounds or other Referrals:  None Maternal Substance Abuse:  No Significant Maternal Medications:  None Significant Maternal Lab Results: Group B Strep negative   Assessment/Plan Principal Problem:   History of 2 cesarean sections Active Problems:   Rh negative state in antepartum period   Chronic hypertension during pregnancy   OSA on CPAP   Morbid obesity with BMI of 60.0-69.9, adult (HCC)   For RCS and BTL Risks include but are not limited to bleeding, infection, injury to surrounding structures, including bowel, bladder and ureters, blood clots, and death.  Likelihood of success is high.  Reva Bores 06/29/2020, 12:18 PM

## 2020-06-29 NOTE — Progress Notes (Signed)
   Subjective:  Danielle [name] is a 35 y.o. K2H0623 at [redacted]w[redacted]d being seen today for ongoing prenatal care.  She is currently monitored for the following issues for this high-risk pregnancy and has Rh negative state in antepartum period; Anemia; Hirsutism; Supervision of high risk pregnancy, antepartum; Advanced maternal age in multigravida; History of 2 cesarean sections; History of poor fetal growth; History of preterm delivery; Alpha-thalassemia (HCC); COVID-19 vaccine administered; Chronic hypertension during pregnancy; and Alpha thalassemia silent carrier on their problem list.  Patient reports no complaints.  Contractions: Irritability. Vag. Bleeding: None.  Movement: Present. Denies leaking of fluid.   The following portions of the patient's history were reviewed and updated as appropriate: allergies, current medications, past family history, past medical history, past social history, past surgical history and problem list. Problem list updated.  Objective:   Vitals:   06/29/20 1016  BP: 119/81  Pulse: (!) 103  Weight: (!) 393 lb 1.6 oz (178.3 kg)    Fetal Status:     Movement: Present     General:  Alert, oriented and cooperative. Patient is in no acute distress.  Skin: Skin is warm and dry. No rash noted.   Cardiovascular: Normal heart rate noted  Respiratory: Normal respiratory effort, no problems with respiration noted  Abdomen: Soft, gravid, appropriate for gestational age. Pain/Pressure: Present     Pelvic: Vag. Bleeding: None     Cervical exam deferred        Extremities: Normal range of motion.  Edema: Trace  Mental Status: Normal mood and affect. Normal behavior. Normal judgment and thought content.   Urinalysis:      Assessment and Plan:  Pregnancy: [id number] at [redacted]w[redacted]d  1. Supervision of high risk pregnancy, antepartum BP and FHR normal  2. Rh negative state in antepartum period S/p rhogam  3. History of preterm delivery   4. History of 2 cesarean  sections Scheduled for RCS+BTL this coming Sunday  5. Chronic hypertension during pregnancy BP well controlled today on no meds Most recent growth [place] normal Weekly antenatal testing has been reassuring, next BPP scheduled for tomorrow  6. Multigravida of advanced maternal age in third trimester    Term labor symptoms and general obstetric precautions including but not limited to vaginal bleeding, contractions, leaking of fluid and fetal movement were reviewed in detail with the patient. Please refer to After Visit Summary for other counseling recommendations.  Return in 6 weeks (on 08/10/2020) for PP check.   Venora Maples, MD

## 2020-06-30 ENCOUNTER — Ambulatory Visit (INDEPENDENT_AMBULATORY_CARE_PROVIDER_SITE_OTHER): Payer: Medicaid Other

## 2020-06-30 ENCOUNTER — Ambulatory Visit: Payer: Medicaid Other | Admitting: *Deleted

## 2020-06-30 VITALS — BP 106/64 | HR 97 | Wt 393.1 lb

## 2020-06-30 DIAGNOSIS — O99213 Obesity complicating pregnancy, third trimester: Secondary | ICD-10-CM

## 2020-06-30 DIAGNOSIS — O10919 Unspecified pre-existing hypertension complicating pregnancy, unspecified trimester: Secondary | ICD-10-CM | POA: Diagnosis not present

## 2020-06-30 NOTE — Progress Notes (Signed)

## 2020-07-02 ENCOUNTER — Other Ambulatory Visit: Payer: Self-pay

## 2020-07-02 ENCOUNTER — Other Ambulatory Visit (HOSPITAL_COMMUNITY)
Admission: RE | Admit: 2020-07-02 | Discharge: 2020-07-02 | Disposition: A | Discharge status: Home / Self Care | Payer: Medicaid Other | Source: Ambulatory Visit | Attending: Family Medicine | Admitting: Family Medicine

## 2020-07-02 ENCOUNTER — Encounter (HOSPITAL_COMMUNITY)
Admission: RE | Admit: 2020-07-02 | Discharge: 2020-07-02 | Disposition: A | Discharge status: Home / Self Care | Payer: Medicaid Other | Source: Ambulatory Visit | Attending: Family Medicine | Admitting: Family Medicine

## 2020-07-02 DIAGNOSIS — Z01812 Encounter for preprocedural laboratory examination: Secondary | ICD-10-CM | POA: Insufficient documentation

## 2020-07-02 DIAGNOSIS — Z20822 Contact with and (suspected) exposure to covid-19: Secondary | ICD-10-CM | POA: Diagnosis not present

## 2020-07-02 LAB — CBC
HCT: 32.4 % — ABNORMAL LOW (ref 36.0–46.0)
Hemoglobin: 9.6 g/dL — ABNORMAL LOW (ref 12.0–15.0)
MCH: 22.9 pg — ABNORMAL LOW (ref 26.0–34.0)
MCHC: 29.6 g/dL — ABNORMAL LOW (ref 30.0–36.0)
MCV: 77.1 fL — ABNORMAL LOW (ref 80.0–100.0)
Platelets: 267 10*3/uL (ref 150–400)
RBC: 4.2 MIL/uL (ref 3.87–5.11)
RDW: 14.5 % (ref 11.5–15.5)
WBC: 9.1 10*3/uL (ref 4.0–10.5)
nRBC: 0 % (ref 0.0–0.2)

## 2020-07-02 LAB — TYPE AND SCREEN
ABO/RH(D): B NEG
Antibody Screen: NEGATIVE

## 2020-07-02 LAB — SARS CORONAVIRUS 2 (TAT 6-24 HRS): SARS Coronavirus 2: NEGATIVE

## 2020-07-03 LAB — RPR: RPR Ser Ql: NONREACTIVE

## 2020-07-04 ENCOUNTER — Inpatient Hospital Stay (HOSPITAL_COMMUNITY)
Admission: RE | Admit: 2020-07-04 | Discharge: 2020-07-06 | DRG: 784 | Disposition: A | Discharge status: Home / Self Care | Payer: Medicaid Other | Attending: Family Medicine | Admitting: Family Medicine

## 2020-07-04 ENCOUNTER — Inpatient Hospital Stay (HOSPITAL_COMMUNITY): Payer: Medicaid Other | Admitting: Certified Registered Nurse Anesthetist

## 2020-07-04 ENCOUNTER — Encounter (HOSPITAL_COMMUNITY): Payer: Self-pay | Admitting: Family Medicine

## 2020-07-04 ENCOUNTER — Encounter (HOSPITAL_COMMUNITY)
Admission: RE | Disposition: A | Discharge status: Home / Self Care | Payer: Self-pay | Source: Home / Self Care | Attending: Family Medicine

## 2020-07-04 ENCOUNTER — Other Ambulatory Visit: Payer: Self-pay

## 2020-07-04 DIAGNOSIS — O26899 Other specified pregnancy related conditions, unspecified trimester: Secondary | ICD-10-CM

## 2020-07-04 DIAGNOSIS — Z3A37 37 weeks gestation of pregnancy: Secondary | ICD-10-CM | POA: Diagnosis not present

## 2020-07-04 DIAGNOSIS — O26893 Other specified pregnancy related conditions, third trimester: Secondary | ICD-10-CM | POA: Diagnosis not present

## 2020-07-04 DIAGNOSIS — Z3A38 38 weeks gestation of pregnancy: Secondary | ICD-10-CM | POA: Diagnosis not present

## 2020-07-04 DIAGNOSIS — G4733 Obstructive sleep apnea (adult) (pediatric): Secondary | ICD-10-CM

## 2020-07-04 DIAGNOSIS — Z302 Encounter for sterilization: Secondary | ICD-10-CM

## 2020-07-04 DIAGNOSIS — O99214 Obesity complicating childbirth: Secondary | ICD-10-CM | POA: Diagnosis present

## 2020-07-04 DIAGNOSIS — O34211 Maternal care for low transverse scar from previous cesarean delivery: Principal | ICD-10-CM | POA: Diagnosis present

## 2020-07-04 DIAGNOSIS — O99354 Diseases of the nervous system complicating childbirth: Secondary | ICD-10-CM | POA: Diagnosis present

## 2020-07-04 DIAGNOSIS — D563 Thalassemia minor: Secondary | ICD-10-CM | POA: Diagnosis present

## 2020-07-04 DIAGNOSIS — O34219 Maternal care for unspecified type scar from previous cesarean delivery: Secondary | ICD-10-CM | POA: Diagnosis present

## 2020-07-04 DIAGNOSIS — Z98891 History of uterine scar from previous surgery: Secondary | ICD-10-CM

## 2020-07-04 DIAGNOSIS — Z6791 Unspecified blood type, Rh negative: Secondary | ICD-10-CM | POA: Diagnosis not present

## 2020-07-04 DIAGNOSIS — O9902 Anemia complicating childbirth: Secondary | ICD-10-CM | POA: Diagnosis present

## 2020-07-04 DIAGNOSIS — Z9989 Dependence on other enabling machines and devices: Secondary | ICD-10-CM

## 2020-07-04 DIAGNOSIS — O99844 Bariatric surgery status complicating childbirth: Secondary | ICD-10-CM | POA: Diagnosis not present

## 2020-07-04 DIAGNOSIS — O10919 Unspecified pre-existing hypertension complicating pregnancy, unspecified trimester: Secondary | ICD-10-CM | POA: Diagnosis present

## 2020-07-04 DIAGNOSIS — O0990 Supervision of high risk pregnancy, unspecified, unspecified trimester: Secondary | ICD-10-CM

## 2020-07-04 DIAGNOSIS — O1002 Pre-existing essential hypertension complicating childbirth: Secondary | ICD-10-CM | POA: Diagnosis present

## 2020-07-04 DIAGNOSIS — O1092 Unspecified pre-existing hypertension complicating childbirth: Secondary | ICD-10-CM | POA: Diagnosis not present

## 2020-07-04 DIAGNOSIS — D649 Anemia, unspecified: Secondary | ICD-10-CM | POA: Diagnosis present

## 2020-07-04 SURGERY — Surgical Case
Anesthesia: Spinal | Laterality: Bilateral

## 2020-07-04 MED ORDER — DIPHENHYDRAMINE HCL 25 MG PO CAPS: 25.0000 mg | ORAL_CAPSULE | Freq: Four times a day (QID) | ORAL | Status: DC | PRN

## 2020-07-04 MED ORDER — PHENYLEPHRINE HCL-NACL 20-0.9 MG/250ML-% IV SOLN
INTRAVENOUS | Status: AC
  Filled 2020-07-04: qty 250

## 2020-07-04 MED ORDER — FENTANYL CITRATE (PF) 100 MCG/2ML IJ SOLN
INTRAMUSCULAR | Status: DC | PRN
  Administered 2020-07-04: 15 ug via INTRATHECAL

## 2020-07-04 MED ORDER — OXYTOCIN-SODIUM CHLORIDE 30-0.9 UT/500ML-% IV SOLN: 2.5000 [IU]/h | INTRAVENOUS | Status: AC

## 2020-07-04 MED ORDER — NALBUPHINE HCL 10 MG/ML IJ SOLN: 5.0000 mg | INTRAMUSCULAR | Status: DC | PRN

## 2020-07-04 MED ORDER — HYDROMORPHONE HCL 1 MG/ML IJ SOLN
0.2500 mg | INTRAMUSCULAR | Status: DC | PRN
  Administered 2020-07-04 (×2): 0.5 mg via INTRAVENOUS

## 2020-07-04 MED ORDER — CEFAZOLIN IN SODIUM CHLORIDE 3-0.9 GM/100ML-% IV SOLN
3.0000 g | INTRAVENOUS | Status: AC
  Administered 2020-07-04: 3 g via INTRAVENOUS

## 2020-07-04 MED ORDER — DEXAMETHASONE SODIUM PHOSPHATE 4 MG/ML IJ SOLN
INTRAMUSCULAR | Status: DC | PRN
  Administered 2020-07-04: 10 mg via INTRAVENOUS

## 2020-07-04 MED ORDER — TETANUS-DIPHTH-ACELL PERTUSSIS 5-2.5-18.5 LF-MCG/0.5 IM SUSY
0.5000 mL | PREFILLED_SYRINGE | Freq: Once | INTRAMUSCULAR | Status: DC
  Filled 2020-07-04: qty 0.5

## 2020-07-04 MED ORDER — MORPHINE SULFATE (PF) 0.5 MG/ML IJ SOLN
INTRAMUSCULAR | Status: DC | PRN
  Administered 2020-07-04: 150 ug via INTRATHECAL

## 2020-07-04 MED ORDER — OXYTOCIN-SODIUM CHLORIDE 30-0.9 UT/500ML-% IV SOLN
INTRAVENOUS | Status: AC
  Filled 2020-07-04: qty 500

## 2020-07-04 MED ORDER — LACTATED RINGERS IV SOLN: INTRAVENOUS | Status: DC

## 2020-07-04 MED ORDER — MEPERIDINE HCL 25 MG/ML IJ SOLN: 6.2500 mg | INTRAMUSCULAR | Status: DC | PRN

## 2020-07-04 MED ORDER — BUPIVACAINE HCL (PF) 0.25 % IJ SOLN
INTRAMUSCULAR | Status: DC | PRN
  Administered 2020-07-04: 40 mL

## 2020-07-04 MED ORDER — PROMETHAZINE HCL 25 MG/ML IJ SOLN: 6.2500 mg | INTRAMUSCULAR | Status: DC | PRN

## 2020-07-04 MED ORDER — DIBUCAINE (PERIANAL) 1 % EX OINT
1.0000 "application " | TOPICAL_OINTMENT | CUTANEOUS | Status: DC | PRN
  Filled 2020-07-04: qty 28

## 2020-07-04 MED ORDER — DIPHENHYDRAMINE HCL 25 MG PO CAPS: 25.0000 mg | ORAL_CAPSULE | ORAL | Status: DC | PRN

## 2020-07-04 MED ORDER — SIMETHICONE 80 MG PO CHEW
80.0000 mg | CHEWABLE_TABLET | ORAL | Status: DC | PRN
  Filled 2020-07-04: qty 1

## 2020-07-04 MED ORDER — NALOXONE HCL 0.4 MG/ML IJ SOLN: 0.4000 mg | INTRAMUSCULAR | Status: DC | PRN

## 2020-07-04 MED ORDER — COCONUT OIL OIL
1.0000 "application " | TOPICAL_OIL | Status: DC | PRN
  Filled 2020-07-04: qty 120

## 2020-07-04 MED ORDER — ACETAMINOPHEN 325 MG PO TABS
650.0000 mg | ORAL_TABLET | ORAL | Status: DC | PRN
  Filled 2020-07-04: qty 2

## 2020-07-04 MED ORDER — NALOXONE HCL 4 MG/10ML IJ SOLN
1.0000 ug/kg/h | INTRAVENOUS | Status: DC | PRN
  Filled 2020-07-04: qty 5

## 2020-07-04 MED ORDER — BUPIVACAINE HCL (PF) 0.25 % IJ SOLN
INTRAMUSCULAR | Status: AC
  Filled 2020-07-04: qty 40

## 2020-07-04 MED ORDER — BUPIVACAINE IN DEXTROSE 0.75-8.25 % IT SOLN
INTRATHECAL | Status: DC | PRN
  Administered 2020-07-04: 2 mL via INTRATHECAL

## 2020-07-04 MED ORDER — FENTANYL CITRATE (PF) 100 MCG/2ML IJ SOLN
INTRAMUSCULAR | Status: AC
  Filled 2020-07-04: qty 2

## 2020-07-04 MED ORDER — KETOROLAC TROMETHAMINE 30 MG/ML IJ SOLN: 30.0000 mg | Freq: Four times a day (QID) | INTRAMUSCULAR | Status: AC | PRN

## 2020-07-04 MED ORDER — MORPHINE SULFATE (PF) 0.5 MG/ML IJ SOLN
INTRAMUSCULAR | Status: AC
  Filled 2020-07-04: qty 10

## 2020-07-04 MED ORDER — HYDROMORPHONE HCL 1 MG/ML IJ SOLN
INTRAMUSCULAR | Status: AC
  Filled 2020-07-04: qty 0.5

## 2020-07-04 MED ORDER — SOD CITRATE-CITRIC ACID 500-334 MG/5ML PO SOLN
30.0000 mL | ORAL | Status: AC
  Administered 2020-07-04: 30 mL via ORAL

## 2020-07-04 MED ORDER — ONDANSETRON HCL 4 MG/2ML IJ SOLN: 4.0000 mg | Freq: Three times a day (TID) | INTRAMUSCULAR | Status: DC | PRN

## 2020-07-04 MED ORDER — NALBUPHINE HCL 10 MG/ML IJ SOLN
5.0000 mg | Freq: Once | INTRAMUSCULAR | Status: DC | PRN
Start: 2020-07-04 — End: 2020-07-06

## 2020-07-04 MED ORDER — OXYTOCIN-SODIUM CHLORIDE 30-0.9 UT/500ML-% IV SOLN
INTRAVENOUS | Status: DC | PRN
  Administered 2020-07-04: 30 mL via INTRAVENOUS

## 2020-07-04 MED ORDER — HYDROMORPHONE HCL 1 MG/ML IJ SOLN
INTRAMUSCULAR | Status: AC
  Filled 2020-07-04: qty 1

## 2020-07-04 MED ORDER — NALBUPHINE HCL 10 MG/ML IJ SOLN: 5.0000 mg | Freq: Once | INTRAMUSCULAR | Status: DC | PRN

## 2020-07-04 MED ORDER — OXYCODONE HCL 5 MG/5ML PO SOLN: 5.0000 mg | Freq: Once | ORAL | Status: DC | PRN

## 2020-07-04 MED ORDER — MENTHOL 3 MG MT LOZG
1.0000 | LOZENGE | OROMUCOSAL | Status: DC | PRN
  Filled 2020-07-04: qty 9

## 2020-07-04 MED ORDER — CEFAZOLIN IN SODIUM CHLORIDE 3-0.9 GM/100ML-% IV SOLN
INTRAVENOUS | Status: AC
  Filled 2020-07-04: qty 100

## 2020-07-04 MED ORDER — SCOPOLAMINE 1 MG/3DAYS TD PT72: 1.0000 | MEDICATED_PATCH | Freq: Once | TRANSDERMAL | Status: DC

## 2020-07-04 MED ORDER — ONDANSETRON HCL 4 MG/2ML IJ SOLN
INTRAMUSCULAR | Status: AC
  Filled 2020-07-04: qty 4

## 2020-07-04 MED ORDER — GABAPENTIN 300 MG PO CAPS
ORAL_CAPSULE | ORAL | Status: AC
  Filled 2020-07-04: qty 1

## 2020-07-04 MED ORDER — DIPHENHYDRAMINE HCL 50 MG/ML IJ SOLN
12.5000 mg | INTRAMUSCULAR | Status: DC | PRN
Start: 2020-07-04 — End: 2020-07-06

## 2020-07-04 MED ORDER — GABAPENTIN 300 MG PO CAPS
300.0000 mg | ORAL_CAPSULE | ORAL | Status: AC
  Administered 2020-07-04: 300 mg via ORAL

## 2020-07-04 MED ORDER — ACETAMINOPHEN 500 MG PO TABS
1000.0000 mg | ORAL_TABLET | ORAL | Status: AC
  Administered 2020-07-04: 1000 mg via ORAL

## 2020-07-04 MED ORDER — OXYCODONE HCL 5 MG PO TABS
5.0000 mg | ORAL_TABLET | ORAL | Status: DC | PRN
  Administered 2020-07-04 (×2): 5 mg via ORAL
  Administered 2020-07-05: 10 mg via ORAL
  Administered 2020-07-05 (×3): 5 mg via ORAL
  Administered 2020-07-05 – 2020-07-06 (×4): 10 mg via ORAL
  Filled 2020-07-04 (×3): qty 2
  Filled 2020-07-04: qty 1
  Filled 2020-07-04 (×2): qty 2
  Filled 2020-07-04 (×4): qty 1

## 2020-07-04 MED ORDER — MEASLES, MUMPS & RUBELLA VAC IJ SOLR
0.5000 mL | Freq: Once | INTRAMUSCULAR | Status: DC
  Filled 2020-07-04: qty 0.5

## 2020-07-04 MED ORDER — PHENYLEPHRINE HCL-NACL 20-0.9 MG/250ML-% IV SOLN
INTRAVENOUS | Status: DC | PRN
  Administered 2020-07-04: 60 ug/min via INTRAVENOUS

## 2020-07-04 MED ORDER — POVIDONE-IODINE 10 % EX SWAB
2.0000 "application " | Freq: Once | CUTANEOUS | Status: AC
  Administered 2020-07-04: 2 via TOPICAL

## 2020-07-04 MED ORDER — OXYCODONE HCL 5 MG PO TABS: 5.0000 mg | ORAL_TABLET | Freq: Once | ORAL | Status: DC | PRN

## 2020-07-04 MED ORDER — LACTATED RINGERS IV SOLN
INTRAVENOUS | Status: DC
  Administered 2020-07-04: 125 mL/h via INTRAVENOUS

## 2020-07-04 MED ORDER — ACETAMINOPHEN 500 MG PO TABS
1000.0000 mg | ORAL_TABLET | Freq: Four times a day (QID) | ORAL | Status: AC
  Administered 2020-07-04 – 2020-07-05 (×3): 1000 mg via ORAL
  Filled 2020-07-04 (×3): qty 2

## 2020-07-04 MED ORDER — GABAPENTIN 100 MG PO CAPS
100.0000 mg | ORAL_CAPSULE | Freq: Two times a day (BID) | ORAL | Status: DC | PRN
  Administered 2020-07-04: 100 mg via ORAL
  Filled 2020-07-04: qty 1

## 2020-07-04 MED ORDER — SENNOSIDES-DOCUSATE SODIUM 8.6-50 MG PO TABS
2.0000 | ORAL_TABLET | ORAL | Status: DC
  Administered 2020-07-04 – 2020-07-05 (×2): 2 via ORAL
  Filled 2020-07-04 (×3): qty 2

## 2020-07-04 MED ORDER — SOD CITRATE-CITRIC ACID 500-334 MG/5ML PO SOLN
ORAL | Status: AC
  Filled 2020-07-04: qty 30

## 2020-07-04 MED ORDER — PRENATAL MULTIVITAMIN CH
1.0000 | ORAL_TABLET | Freq: Every day | ORAL | Status: DC
  Administered 2020-07-04 – 2020-07-05 (×2): 1 via ORAL
  Filled 2020-07-04 (×3): qty 1

## 2020-07-04 MED ORDER — SODIUM CHLORIDE 0.9% FLUSH: 3.0000 mL | INTRAVENOUS | Status: DC | PRN

## 2020-07-04 MED ORDER — ENOXAPARIN SODIUM 100 MG/ML IJ SOSY
90.0000 mg | PREFILLED_SYRINGE | INTRAMUSCULAR | Status: DC
  Administered 2020-07-05: 90 mg via SUBCUTANEOUS
  Filled 2020-07-04 (×2): qty 0.9

## 2020-07-04 MED ORDER — SIMETHICONE 80 MG PO CHEW
80.0000 mg | CHEWABLE_TABLET | Freq: Three times a day (TID) | ORAL | Status: DC
  Administered 2020-07-04 – 2020-07-06 (×5): 80 mg via ORAL
  Filled 2020-07-04 (×6): qty 1

## 2020-07-04 MED ORDER — ACETAMINOPHEN 500 MG PO TABS
ORAL_TABLET | ORAL | Status: AC
  Filled 2020-07-04: qty 2

## 2020-07-04 MED ORDER — DEXAMETHASONE SODIUM PHOSPHATE 10 MG/ML IJ SOLN
INTRAMUSCULAR | Status: AC
  Filled 2020-07-04: qty 1

## 2020-07-04 MED ORDER — ONDANSETRON HCL 4 MG/2ML IJ SOLN
INTRAMUSCULAR | Status: DC | PRN
  Administered 2020-07-04 (×2): 4 mg via INTRAVENOUS

## 2020-07-04 MED ORDER — WITCH HAZEL-GLYCERIN EX PADS: 1.0000 "application " | MEDICATED_PAD | CUTANEOUS | Status: DC | PRN

## 2020-07-04 SURGICAL SUPPLY — 39 items
APL SKNCLS STERI-STRIP NONHPOA (GAUZE/BANDAGES/DRESSINGS) ×1
BENZOIN TINCTURE PRP APPL 2/3 (GAUZE/BANDAGES/DRESSINGS) ×2 IMPLANT
CHLORAPREP W/TINT 26ML (MISCELLANEOUS) ×2 IMPLANT
CLAMP CORD UMBIL (MISCELLANEOUS) IMPLANT
CLOTH BEACON ORANGE TIMEOUT ST (SAFETY) ×2 IMPLANT
DRESSING PREVENA PLUS CUSTOM (GAUZE/BANDAGES/DRESSINGS) ×1 IMPLANT
DRSG OPSITE POSTOP 4X10 (GAUZE/BANDAGES/DRESSINGS) ×2 IMPLANT
DRSG PREVENA PLUS CUSTOM (GAUZE/BANDAGES/DRESSINGS) ×2
ELECT REM PT RETURN 9FT ADLT (ELECTROSURGICAL) ×2
ELECTRODE REM PT RTRN 9FT ADLT (ELECTROSURGICAL) ×1 IMPLANT
EXTENDER TRAXI PANNICULUS (MISCELLANEOUS) ×1 IMPLANT
EXTRACTOR VACUUM M CUP 4 TUBE (SUCTIONS) IMPLANT
GLOVE BIOGEL PI IND STRL 7.0 (GLOVE) ×3 IMPLANT
GLOVE BIOGEL PI INDICATOR 7.0 (GLOVE) ×3
GLOVE ECLIPSE 7.0 STRL STRAW (GLOVE) ×2 IMPLANT
GOWN STRL REUS W/TWL LRG LVL3 (GOWN DISPOSABLE) ×6 IMPLANT
KIT ABG SYR 3ML LUER SLIP (SYRINGE) IMPLANT
NEEDLE HYPO 22GX1.5 SAFETY (NEEDLE) ×2 IMPLANT
NEEDLE HYPO 25X5/8 SAFETYGLIDE (NEEDLE) IMPLANT
NS IRRIG 1000ML POUR BTL (IV SOLUTION) ×2 IMPLANT
PACK C SECTION WH (CUSTOM PROCEDURE TRAY) ×2 IMPLANT
PAD ABD 7.5X8 STRL (GAUZE/BANDAGES/DRESSINGS) ×2 IMPLANT
PAD OB MATERNITY 4.3X12.25 (PERSONAL CARE ITEMS) ×2 IMPLANT
PENCIL SMOKE EVAC W/HOLSTER (ELECTROSURGICAL) ×2 IMPLANT
RETRACTOR TRAXI PANNICULUS (MISCELLANEOUS) ×2 IMPLANT
RTRCTR C-SECT PINK 25CM LRG (MISCELLANEOUS) IMPLANT
SPONGE GAUZE 4X4 12PLY STER LF (GAUZE/BANDAGES/DRESSINGS) ×2 IMPLANT
STRIP CLOSURE SKIN 1/2X4 (GAUZE/BANDAGES/DRESSINGS) ×2 IMPLANT
SUT MNCRL 0 VIOLET CTX 36 (SUTURE) ×2 IMPLANT
SUT MONOCRYL 0 CTX 36 (SUTURE) ×2
SUT VIC AB 0 CTX 36 (SUTURE) ×2
SUT VIC AB 0 CTX36XBRD ANBCTRL (SUTURE) ×1 IMPLANT
SUT VIC AB 4-0 KS 27 (SUTURE) ×2 IMPLANT
SYR 30ML LL (SYRINGE) ×2 IMPLANT
TOWEL OR 17X24 6PK STRL BLUE (TOWEL DISPOSABLE) ×2 IMPLANT
TRAXI PANNICULUS EXTENDER (MISCELLANEOUS) ×1
TRAXI PANNICULUS RETRACTOR (MISCELLANEOUS) ×2
TRAY FOLEY W/BAG SLVR 14FR LF (SET/KITS/TRAYS/PACK) ×2 IMPLANT
WATER STERILE IRR 1000ML POUR (IV SOLUTION) ×2 IMPLANT

## 2020-07-04 NOTE — Discharge Summary (Signed)
Postpartum Discharge Summary  Date of Service updated 07/06/20     Patient Name: Danielle [name] DOB: [birth year] MRN: [id number]  Date of admission: 07/04/2020 Delivery date:07/04/2020  Delivering provider: Donnamae Jude  Date of discharge: 07/06/2020  Admitting diagnosis: Previous cesarean section complicating pregnancy, antepartum condition or complication [X32.355] Intrauterine pregnancy: [redacted]w[redacted]d    Secondary diagnosis:  Principal Problem:   History of 2 cesarean sections Active Problems:   Rh negative state in antepartum period   Anemia   Supervision of high risk pregnancy, antepartum   Chronic hypertension during pregnancy   Alpha thalassemia silent carrier   OSA on CPAP   Morbid obesity with BMI of 60.0-69.9, adult (HEmpire   Previous cesarean section complicating pregnancy, antepartum condition or complication  Additional problems: none    Discharge diagnosis: Term Pregnancy Delivered, CHTN, and Anemia                                              Post partum procedures:postpartum tubal ligation Augmentation: N/A Complications: None  Hospital course: Sceduled C/S   35y.o. yo [id number] admitted to the hospital 07/04/2020 for scheduled cesarean section with the following indication:Elective Repeat.Delivery details are as follows:  Membrane Rupture Time/Date: 11:15 AM ,07/04/2020   Delivery Method:C-Section, Low Transverse  Details of operation can be found in separate operative note.  Patient had an uncomplicated postpartum course.  She is ambulating, tolerating a regular diet, passing flatus, and urinating well. Patient is discharged home in stable condition on  07/06/20        Newborn Data: Birth date:07/04/2020  Birth time:11:15 AM  Gender:Female  Living status:Living  Apgars:9 ,10  Weight:2845 g     Magnesium Sulfate received: No BMZ received: No Rhophylac:Yes MMR:N/A T-DaP:Given prenatally Flu: Yes Transfusion:No  Physical exam  Vitals:   07/05/20  1300 07/06/20 0217 07/06/20 0220 07/06/20 0500  BP: 128/69 (!) 153/76 137/83 124/68  Pulse: 78 80  89  Resp: 18     Temp: 98 F (36.7 C) 98.4 F (36.9 C)  97.6 F (36.4 C)  TempSrc: Oral Oral  Oral  SpO2:  100%  98%  Weight:      Height:       General: alert, cooperative, and no distress Lochia: appropriate Uterine Fundus: firm Incision: wound vac in place DVT Evaluation: No evidence of DVT seen on physical exam. Labs: Lab Results  Component Value Date   WBC 12.5 (H) 07/05/2020   HGB 9.1 (L) 07/05/2020   HCT 31.0 (L) 07/05/2020   MCV 78.1 (L) 07/05/2020   PLT 228 07/05/2020   CMP Latest Ref Rng & Units 04/06/2020  Glucose 65 - 99 mg/dL 78  BUN 6 - 20 mg/dL 6  Creatinine 0.57 - 1.00 mg/dL 0.44(L)  Sodium 134 - 144 mmol/L 136  Potassium 3.5 - 5.2 mmol/L 4.2  Chloride 96 - 106 mmol/L 100  CO2 20 - 29 mmol/L 20  Calcium 8.7 - 10.2 mg/dL 9.2  Total Protein 6.0 - 8.5 g/dL 6.4  Total Bilirubin 0.0 - 1.2 mg/dL <0.2  Alkaline Phos 44 - 121 IU/L 53  AST 0 - 40 IU/L 13  ALT 0 - 32 IU/L 11   Edinburgh Score: Edinburgh Postnatal Depression Scale Screening Tool 07/04/2020  I have been able to laugh and see the funny side of things. 0  I have looked forward with enjoyment to things. 0  I have blamed myself unnecessarily when things went wrong. 0  I have been anxious or worried for no good reason. 0  I have felt scared or panicky for no good reason. 0  Things have been getting on top of me. 0  I have been so unhappy that I have had difficulty sleeping. 0  I have felt sad or miserable. 0  I have been so unhappy that I have been crying. 0  The thought of harming myself has occurred to me. 0  Edinburgh Postnatal Depression Scale Total 0     After visit meds:  Allergies as of 07/06/2020       Reactions   Nsaids    Gastric sleeve surgery- contraindicated        Medication List     TAKE these medications    acetaminophen 500 MG tablet Commonly known as: TYLENOL Take  500-1,000 mg by mouth every 6 (six) hours as needed for moderate pain or mild pain.   amLODipine 5 MG tablet Commonly known as: NORVASC Take 1 tablet (5 mg total) by mouth daily.   coconut oil Oil Apply 1 application topically as needed.   Iron (Ferrous Gluconate) 256 (28 Fe) MG Tabs Take 1 tablet by mouth every other day. What changed: how much to take   oxyCODONE 5 MG immediate release tablet Commonly known as: Oxy IR/ROXICODONE Take 1-2 tablets (5-10 mg total) by mouth every 4 (four) hours as needed for moderate pain.   PRENATAL GUMMIES PO Take 1 capsule by mouth daily.         Discharge home in stable condition Infant Feeding: Breast Infant Disposition:home with mother Discharge instruction: per After Visit Summary and Postpartum booklet. Activity: Advance as tolerated. Pelvic rest for 6 weeks.  Diet: routine diet Future Appointments: Future Appointments  Date Time Provider [place]  07/12/2020 10:00 AM [facility] NURSE [facility]  08/11/2020  1:35 PM Gabriel Carina, CNM [facility]   Follow up Visit: Message sent to [facility] 07/04/20 by Sylvester Harder.   Please schedule this patient for a In person postpartum visit in 6 weeks with the following provider: Any provider. Additional Postpartum F/U:Incision check 1 week and BP check 1 week  High risk pregnancy complicated by: HTN Delivery mode:  C-Section, Low Transverse  Anticipated Birth Control:  BTL done [facility]   4/36/0677 Arrie Senate, MD

## 2020-07-04 NOTE — Anesthesia Postprocedure Evaluation (Signed)
Anesthesia Post Note  Patient: Danielle [name]  Procedure(s) Performed: CESAREAN SECTION WITH BILATERAL TUBAL LIGATION (Bilateral)     Patient location during evaluation: PACU Anesthesia Type: Combined Spinal/Epidural Level of consciousness: awake and alert and oriented Pain management: pain level controlled Vital Signs Assessment: post-procedure vital signs reviewed and stable Respiratory status: spontaneous breathing, nonlabored ventilation and respiratory function stable Cardiovascular status: blood pressure returned to baseline and stable Postop Assessment: no headache, no backache, spinal receding, patient able to bend at knees and no apparent nausea or vomiting Anesthetic complications: no   No notable events documented.  Last Vitals:  Vitals:   07/04/20 1230 07/04/20 1245  BP: 120/74 115/63  Pulse: 90 94  Resp: 17 13  Temp:    SpO2: 97% 97%    Last Pain:  Vitals:   07/04/20 1245  TempSrc:   PainSc: 5    Pain Goal:                Epidural/Spinal Function Cutaneous sensation: Tingles (07/04/20 1230), Patient able to flex knees: Yes (07/04/20 1230), Patient able to lift hips off bed: Yes (07/04/20 1230), Back pain beyond tenderness at insertion site: No (07/04/20 1230), Progressively worsening motor and/or sensory loss: No (07/04/20 1230), Bowel and/or bladder incontinence post epidural: No (07/04/20 1230)  Lannie Fields

## 2020-07-04 NOTE — Transfer of Care (Signed)
Immediate Anesthesia Transfer of Care Note  Patient: Danielle [name]  Procedure(s) Performed: CESAREAN SECTION WITH BILATERAL TUBAL LIGATION (Bilateral)  Patient Location: PACU  Anesthesia Type:Spinal  Level of Consciousness: awake, alert  and oriented  Airway & Oxygen Therapy: Patient Spontanous Breathing  Post-op Assessment: Report given to RN and Post -op Vital signs reviewed and stable  Post vital signs: Reviewed and stable  Last Vitals:  Vitals Value Taken Time  BP 119/68 07/04/20 1225  Temp    Pulse 89 07/04/20 1226  Resp 19 07/04/20 1226  SpO2 95 % 07/04/20 1226  Vitals shown include unvalidated device data.  Last Pain:  Vitals:   07/04/20 0741  TempSrc: Oral         Complications: No notable events documented.

## 2020-07-04 NOTE — Anesthesia Procedure Notes (Addendum)
Epidural Patient location during procedure: OB Start time: 07/04/2020 10:34 AM End time: 07/04/2020 10:40 AM  Staffing Anesthesiologist: Lannie Fields, DO Performed: anesthesiologist   Preanesthetic Checklist Completed: patient identified, IV checked, risks and benefits discussed, monitors and equipment checked, pre-op evaluation and timeout performed  Epidural Patient position: sitting Prep: DuraPrep and site prepped and draped Patient monitoring: continuous pulse ox, blood pressure, heart rate and cardiac monitor Approach: midline Location: L3-L4 Injection technique: LOR air  Needle:  Needle type: Tuohy  Needle gauge: 17 G Needle length: 9 cm Needle insertion depth: 10 cm Catheter type: closed end flexible Catheter size: 19 Gauge Catheter at skin depth: 15 cm Test dose: negative  Assessment Sensory level: T8 Events: blood not aspirated, injection not painful, no injection resistance, no paresthesia and negative IV test  Additional Notes CSE performed for c section, no issuesReason for block:surgical anesthesia

## 2020-07-04 NOTE — Interval H&P Note (Signed)
History and Physical Interval Note:  07/04/2020 8:41 AM  Danielle [name]  has presented today for surgery, with the diagnosis of REPEAT CESAREAN SECTION , Undesired Fertility.  The various methods of treatment have been discussed with the patient and family. After consideration of risks, benefits and other options for treatment, the patient has consented to  Procedure(s) with comments: CESAREAN SECTION WITH BILATERAL TUBAL LIGATION (Bilateral) - BMI 67 as a surgical intervention.  The patient's history has been reviewed, patient examined, no change in status, stable for surgery.  I have reviewed the patient's chart and labs.  Questions were answered to the patient's satisfaction.     Reva Bores

## 2020-07-04 NOTE — Op Note (Signed)
Cesarean Delivery Operative Note  Preoperative Diagnosis:  IUP @ [redacted]w[redacted]d, elective repeat cesarean section, desire for permanent sterilization   Postoperative Diagnosis:  Same  Procedure: repeat low transverse cesarean section and bilateral salpingectomy  Surgeon: Tinnie Gens, M.D.  Assistant: Mart Piggs, M.D.  Findings: Viable female infant, APGAR (1 MIN): 9   APGAR (5 MINS): 10   Weight pending, cephalic presentation  Estimated blood loss: 319 cc  Urine output: 100 cc   Intake: 2400cc lactated ringers  Complications: None known  Specimens: Placenta to labor and delivery  Reason for procedure: Briefly, the patient is a 35 y.o. [id number] [redacted]w[redacted]d who presents for elective repeat cesarean section and bilateral salpingectomy.  Procedure: Patient is a to the OR where spinal analgesia was administered. She was then placed in a supine position with left lateral tilt. She received 3 g of Ancef and SCDs were in place. A timeout was performed. She was prepped and draped in the usual sterile fashion. A Foley catheter was placed in the bladder. A knife was then used to make a Pfannenstiel incision. This incision was carried out to underlying fascia which was divided in the midline with the knife. The incision was extended laterally, sharply.  The rectus was divided in the midline.  The peritoneal cavity was entered bluntly.  Alexis retractor was placed inside the incision.  A knife was used to make a low transverse incision on the uterus. This incision was carried down to the amniotic cavity was entered. Fetus was in cephalic position and was brought up out of the incision without difficulty. Cord was clamped x 2 and cut. Infant taken to waiting nurse.  Cord blood was obtained. Placenta was delivered from the uterus.  Uterus was cleaned with dry lap pads. Uterine incision closed with 0 Monocryl suture in a locked running fashion.   Attention was then turned to the left fallopian tube. Kelly forceps  were placed on the mesosalpinx underneath most of the tube x2.  Metzenbaum scissors were used to cut the distal portion of the tube, including the fimbriae. This pedicle was double suture ligated with 2-0 monocryl. The right fallopian tube was then identified, doubly ligated, and was excised in a similar fashion allowing for bilateral tubal sterilization via bilateral salpingectomy.  Good hemostasis was noted overall.  Alexis retractor was removed from the abdomen. Fascia is closed with 0 Vicryl suture in a running fashion. Subcutaneous tissue infused with 30cc 0.25% Marcaine.  Subcutaneous closure was performed with 0 plain suture.  Skin closed using 3-0 Vicryl on a Keith needle.  Steri strips applied, followed by pressure dressing.  All instrument, needle and lap counts were correct x 2.  Patient was awake and taken to PACU stable.  Infant remained with mom in couplet care, stable.   Broadus John FirestoneMD 07/04/2020 12:44 PM

## 2020-07-04 NOTE — Lactation Note (Signed)
This note was copied from a baby's chart. Lactation Consultation Note  Patient Name: Danielle [name] WCHEN'I Date: 07/04/2020 Reason for consult: Initial assessment;Early term 36-38.6wks Age:36 hours, infant had one void that LC changed while in room. Mm latched infant on her left breast using the football hold position, infant latched with depth, swallows observed, infant breastfeed for 20 minutes. Afterwards mom did hand expression and infant was given 2 mls of colostrum by spoon. LC mention to mom that infant may cluster feed 2nd day of life and this is normal infant behavior. Mom made aware of O/P services, breastfeeding support groups, community resources, and our phone # for post-discharge questions.   Mom's plans: 1- Mom will breastfeeding infant according to feeding cues, 8 to 12+ times within 24 hours, STS. 2- Mom will do breast stimulation to keep infant awake while breastfeeding: talking to infant, gently stroking infant's neck and shoulder's , breast compression and undressing infant STS to BF. 3- Mom will get adequate rest, hydration. 4- LC discussed infant's input and output with mom. 5- Mom knows to call RN or LC if she has BF questions, concerns or needs assistance with latching infant at the breast. Maternal Data Has patient been taught Hand Expression?: Yes Does the patient have breastfeeding experience prior to this delivery?: Yes How long did the patient breastfeed?: Per mom, she BF her 1st and 2nd child both for 6 months, her 2nd child is now 24 years of age.  Feeding Mother's Current Feeding Choice: Breast Milk  LATCH Score Latch: Grasps breast easily, tongue down, lips flanged, rhythmical sucking.  Audible Swallowing: Spontaneous and intermittent  Type of Nipple: Everted at rest and after stimulation  Comfort (Breast/Nipple): Soft / non-tender  Hold (Positioning): Assistance needed to correctly position infant at breast and maintain latch.  LATCH Score:  9   Lactation Tools Discussed/Used    Interventions Interventions: Breast feeding basics reviewed;Assisted with latch;Skin to skin;Hand express;Breast compression;Adjust position;Support pillows;Position options;Expressed milk;Education  Discharge Pump: Personal (Mom has DEBP at home.) [facility] Program: Yes  Consult Status Consult Status: Follow-up Date: 07/05/20 Follow-up type: In-patient    [name] 07/04/2020, 4:18 PM

## 2020-07-04 NOTE — Anesthesia Preprocedure Evaluation (Addendum)
Anesthesia Evaluation  Patient identified by MRN, date of birth, ID band Patient awake    Reviewed: Allergy & Precautions, NPO status , Patient's Chart, lab work & pertinent test results  Airway Mallampati: III  TM Distance: >3 FB Neck ROM: Full    Dental no notable dental hx. 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 prior to sleeve, lost 200 w/ sleeve and then gained most of it back w/ 3 pregnancies    Endo/Other  Morbid obesitySuper morbid obesity BMI 68  Renal/GU negative Renal ROS  negative genitourinary   Musculoskeletal  (+) Arthritis , Osteoarthritis,    Abdominal (+) + obese,   Peds  Hematology  (+) Blood dyscrasia, anemia , hct 32.4, plt 267   Anesthesia Other Findings   Reproductive/Obstetrics (+) Pregnancy 3rd c section                           Anesthesia Physical Anesthesia Plan  ASA: 4  Anesthesia Plan: Spinal   Post-op Pain Management:    Induction:   PONV Risk Score and Plan: 3 and Ondansetron, Dexamethasone and Treatment may vary due to age or medical condition  Airway Management Planned: Natural Airway and Nasal Cannula  Additional Equipment: None  Intra-op Plan:   Post-operative Plan:   Informed Consent: I have reviewed the patients History and Physical, chart, labs and discussed the procedure including the risks, benefits and alternatives for the proposed anesthesia with the patient or authorized representative who has indicated his/her understanding and acceptance.      Dental advisory given  Plan Discussed with: CRNA  Anesthesia Plan Comments:        Anesthesia Quick Evaluation

## 2020-07-05 LAB — CBC
HCT: 31 % — ABNORMAL LOW (ref 36.0–46.0)
Hemoglobin: 9.1 g/dL — ABNORMAL LOW (ref 12.0–15.0)
MCH: 22.9 pg — ABNORMAL LOW (ref 26.0–34.0)
MCHC: 29.4 g/dL — ABNORMAL LOW (ref 30.0–36.0)
MCV: 78.1 fL — ABNORMAL LOW (ref 80.0–100.0)
Platelets: 228 10*3/uL (ref 150–400)
RBC: 3.97 MIL/uL (ref 3.87–5.11)
RDW: 14.3 % (ref 11.5–15.5)
WBC: 12.5 10*3/uL — ABNORMAL HIGH (ref 4.0–10.5)
nRBC: 0 % (ref 0.0–0.2)

## 2020-07-05 MED ORDER — RHO D IMMUNE GLOBULIN 1500 UNIT/2ML IJ SOSY
300.0000 ug | PREFILLED_SYRINGE | Freq: Once | INTRAMUSCULAR | Status: AC
  Administered 2020-07-05: 300 ug via INTRAVENOUS
  Filled 2020-07-05: qty 2

## 2020-07-05 NOTE — Lactation Note (Signed)
This note was copied from a baby's chart. Lactation Consultation Note  Patient Name: Danielle [name] FGHWE'X Date: 07/05/2020 Reason for consult: Follow-up assessment;Early term 37-38.6wks Age:36 hours   P3 mother whose infant is now 25 hours old.  This is an ETI at 38+3 weeks.  Mother breast fed her other two children (now 89 and 58 years old) for 6 months each.  Mother had no questions/concerns related to breast feeding.  Discussed feeding on cue or at least every three hours due to gestational age and size; reviewed how to keep baby actively engaged with feedings.  Mother able to return demonstrate hand expression.  Suggested she use her EBM for nipple comfort and also provided coconut oil.  Mother will call for latch assistance as needed.  Mother will continue to practice hand expression before/after feeds to help increase milk supply.  She has a DEBP for home use.  Father present and helping to care for infant.  Mother requested pain medication; alerted RN.   Maternal Data Has patient been taught Hand Expression?: Yes Does the patient have breastfeeding experience prior to this delivery?: Yes How long did the patient breastfeed?: 6 months with each of her other two children  Feeding Mother's Current Feeding Choice: Breast Milk  LATCH Score                    Lactation Tools Discussed/Used    Interventions    Discharge Pump: Personal  Consult Status Consult Status: Follow-up Date: 07/06/20 Follow-up type: In-patient    Dora Sims 07/05/2020, 12:35 PM

## 2020-07-05 NOTE — Social Work (Signed)
CSW received consult for "patient request to see social work." CSW met with MOB to offer support.   CSW met with MOB at the bedside and introduced role. Patient requests assistance with proof of the infant's birth for renewal and certification of WIC/FS services. CSW informed MOB that birth registry will be around to provide documentation. Patient reports understanding and stated another staff had notified her of that as well. CSW assessed MOB for further needs. MOB reports no report no further need.   Kathrin Greathouse, MSW, LCSW [facility] [place] Worker  [phone] 2020/05/26  2:27 PM

## 2020-07-06 ENCOUNTER — Ambulatory Visit: Payer: Medicaid Other

## 2020-07-06 ENCOUNTER — Other Ambulatory Visit (HOSPITAL_COMMUNITY): Payer: Self-pay

## 2020-07-06 ENCOUNTER — Encounter: Payer: Medicaid Other | Admitting: Obstetrics & Gynecology

## 2020-07-06 LAB — RH IG WORKUP (INCLUDES ABO/RH)
Fetal Screen: NEGATIVE
Gestational Age(Wks): 38.3
Unit division: 0

## 2020-07-06 LAB — SURGICAL PATHOLOGY

## 2020-07-06 MED ORDER — AMLODIPINE BESYLATE 5 MG PO TABS
5.0000 mg | ORAL_TABLET | Freq: Every day | ORAL | Status: DC
  Administered 2020-07-06: 5 mg via ORAL
  Filled 2020-07-06: qty 1

## 2020-07-06 MED ORDER — OXYCODONE HCL 5 MG PO TABS
5.0000 mg | ORAL_TABLET | ORAL | 0 refills | Status: DC | PRN
  Filled 2020-07-06: qty 15, 2d supply, fill #0

## 2020-07-06 MED ORDER — COCONUT OIL OIL: 1.0000 "application " | TOPICAL_OIL | 0 refills | Status: AC | PRN

## 2020-07-06 MED ORDER — AMLODIPINE BESYLATE 5 MG PO TABS
5.0000 mg | ORAL_TABLET | Freq: Every day | ORAL | 3 refills | Status: DC
  Filled 2020-07-06: qty 30, 30d supply, fill #0

## 2020-07-06 MED ORDER — ACETAMINOPHEN 500 MG PO TABS
1000.0000 mg | ORAL_TABLET | Freq: Four times a day (QID) | ORAL | Status: DC | PRN
  Administered 2020-07-06: 1000 mg via ORAL
  Filled 2020-07-06: qty 2

## 2020-07-06 NOTE — Lactation Note (Signed)
This note was copied from a baby's chart. Lactation Consultation Note  Patient Name: Danielle [name] TXHFS'F Date: 07/06/2020 Reason for consult: Follow-up assessment Age:36 hours   P3 mother whose infant is now 45 hours old.  This is an ETI at 38+3 weeks.  Mother breast fed her other two children (now 64 and 85 years old) for 6 months each.  Reviewed feeding plan for after discharge.  Mother will continue to feed on cue or at least 8-12 times/24 hours.  She plans to begin pumping with her DEBP after breast feeding and provide any EBM she obtains to baby.  Baby has been voiding/stooling well.  Mother had no further questions/concerns.  Mother has a return pediatric appointment for tomorrow.  She has our OP phone number to use as needed.    Maternal Data    Feeding    LATCH Score                    Lactation Tools Discussed/Used    Interventions    Discharge Discharge Education: Engorgement and breast care  Consult Status Consult Status: Complete Date: 07/06/20 Follow-up type: Call as needed    Madason Rauls R Ewen Varnell 07/06/2020, 1:23 PM

## 2020-07-06 NOTE — Progress Notes (Signed)
Patient had gotten up to the bathroom alone. Patient called nurse and stated that she needed assistance. RN went in to assist patient and patient started to sweat, feel nauseous, see black spots, per patient "Like I need oxygen". Patient began to rock side to side and unable to answer RN. RN called for assistance from charge nurse. RN allowed patient to sniff ammonia and placed cool towel around her neck. Patient became coherent and was able to talk to RN and charge nurse. RN and charge nurse got patient up with stedy and allowed patient to get back in bed. Patients vital signs were taken:     BP: 153/76 ; 137/83   P: 80   T: 98.4    SpO2: 100   Patient was instructed to eat and drink water. Patient stated that she thinks she went too long without eating due to trying to breastfeed the infant and get rest. RN and charge nurse informed patient that she has to take care of herself first in order to be able to take care of the infant. RN informed patient that the pain medication would be given once she ate to prevent an upset stomach. RN will continue to monitor patient.

## 2020-07-06 NOTE — Discharge Instructions (Signed)
-  take tylenol 1000 mg every 6 hours as needed for pain, alternate with ibuprofen 600 mg every 6 hours -take oxycodone as needed if tylenol and ibuprofen aren't working -drink plenty of water to help with breastfeeding -continue prenatal vitamins while you are breastfeeding -take iron pills every other day with vitamin c, this will help healing as well as breast feeding -wound vac off at 1 week appt

## 2020-07-07 ENCOUNTER — Telehealth: Payer: Self-pay

## 2020-07-07 ENCOUNTER — Other Ambulatory Visit: Payer: Self-pay

## 2020-07-07 NOTE — Telephone Encounter (Signed)
Transition Care Management Follow-up Telephone Call Date of discharge and from where: 07/06/2020 from [facility] How have you been since you were released from the hospital? Pt states that she is feeling okay, she is experience some pain. Pt stated that she is taking the pain meds. Pt is able to eat, urinate but has not had a bm.  Any questions or concerns? No  Items Reviewed: Did the pt receive and understand the discharge instructions provided? Yes  Medications obtained and verified? Yes  Other? No  Any new allergies since your discharge? No  Dietary orders reviewed? No Do you have support at home? Yes   Functional Questionnaire: (I = Independent and D = Dependent) ADLs: I  Bathing/Dressing- I  Meal Prep- I  Eating- I  Maintaining continence- I  Transferring/Ambulation- I  Managing Meds- I   Follow up appointments reviewed:  PCP Hospital f/u appt confirmed? No   Specialist Hospital f/u appt confirmed? Yes  Scheduled to see 07/12/2020 with [facility]. Are transportation arrangements needed? No  If their condition worsens, is the pt aware to call PCP or go to the Emergency Dept.? Yes Was the patient provided with contact information for the PCP's office or ED? Yes Was to pt encouraged to call back with questions or concerns? Yes

## 2020-07-12 ENCOUNTER — Ambulatory Visit (INDEPENDENT_AMBULATORY_CARE_PROVIDER_SITE_OTHER): Payer: Medicaid Other

## 2020-07-12 ENCOUNTER — Other Ambulatory Visit: Payer: Self-pay

## 2020-07-12 ENCOUNTER — Encounter: Payer: Self-pay | Admitting: *Deleted

## 2020-07-12 VITALS — BP 120/78 | HR 86 | Ht 64.0 in | Wt 389.7 lb

## 2020-07-12 DIAGNOSIS — Z5189 Encounter for other specified aftercare: Secondary | ICD-10-CM

## 2020-07-12 NOTE — Progress Notes (Signed)
Pt here for Wound check & BP check. BP today is showing to be 120/78-86-p, currently taking Amlodipine 5 mg, will f/u at [facility] visit. Wound Vac removed by Laney Pastor & Christine,CMA.Was advised on how to remove remaining glue at home by using Alcohol.Pt verbalized understanding.Was also advised not to bend or pick up anything over 10 lbs.

## 2020-07-30 ENCOUNTER — Telehealth: Payer: Self-pay | Admitting: *Deleted

## 2020-07-30 NOTE — Telephone Encounter (Signed)
In reviewing immunizations found that pts covid vaccine was dated 03/25/2020.  Documentation shows 03/24/2020 so I changed it in computer. Danielle [name], CMA

## 2020-08-11 ENCOUNTER — Other Ambulatory Visit: Payer: Self-pay

## 2020-08-11 ENCOUNTER — Encounter: Payer: Self-pay | Admitting: Certified Nurse Midwife

## 2020-08-11 ENCOUNTER — Ambulatory Visit (INDEPENDENT_AMBULATORY_CARE_PROVIDER_SITE_OTHER): Payer: Medicaid Other | Admitting: Certified Nurse Midwife

## 2020-08-11 NOTE — Progress Notes (Signed)
Post Partum Visit Note  Danielle [name] is a 35 y.o. [phone] female who presents for a postpartum visit. She is 5 weeks postpartum following a repeat cesarean section.  I have fully reviewed the prenatal and intrapartum course. The delivery was at 38/3 gestational weeks.  Anesthesia:  Spinal/Epidural . Postpartum course has been unremarkable. Baby is doing well. Baby is feeding by breast. Bleeding no bleeding. Bowel function is normal. Bladder function is normal. Patient is sexually active. Contraception method is tubal ligation and vasectomy (already completed). Postpartum depression screening: negative.  She had been taking her norvasc, however ran out 5 days ago. Has not checked her BP at home.    The pregnancy intention screening data noted above was reviewed. Potential methods of contraception were discussed. The patient elected to proceed with No data recorded.   Edinburgh Postnatal Depression Scale - 08/11/20 1351       Edinburgh Postnatal Depression Scale:  In the Past 7 Days   I have been able to laugh and see the funny side of things. 0    I have looked forward with enjoyment to things. 0    I have blamed myself unnecessarily when things went wrong. 0    I have been anxious or worried for no good reason. 0    I have felt scared or panicky for no good reason. 0    Things have been getting on top of me. 0    I have been so unhappy that I have had difficulty sleeping. 0    I have felt sad or miserable. 0    I have been so unhappy that I have been crying. 0    The thought of harming myself has occurred to me. 0    Edinburgh Postnatal Depression Scale Total 0             Health Maintenance Due  Topic Date Due   Pneumococcal Vaccine 73-63 Years old (1 - PCV) Never done   COVID-19 Vaccine (3 - Pfizer risk series) 04/21/2020    The following portions of the patient's history were reviewed and updated as appropriate: allergies, past medical history, past surgical history,  and problem list.  Review of Systems Pertinent items are noted in HPI.  Objective:  BP 116/65   Pulse 84   Ht 5\' 4"  (1.626 m)   Wt (!) 393 lb 9.6 oz (178.5 kg)   Breastfeeding Yes   BMI 67.56 kg/m    General:  alert, cooperative, appears stated age, and no distress   Breasts:  not indicated  Lungs: Normal WOB  Heart:  regular rate and rhythm  Abdomen: soft, non-tender; bowel sounds normal; no masses,  no organomegaly   Wound well approximated incision, Scar tissue medially inferior to well healed incision. NO erythema or tenderness to palpation   GU exam:  not indicated       Assessment:   Normal postpartum exam.  5 weeks postpartum  Plan:   Essential components of care per ACOG recommendations:  1.  Mood and well being: Patient with negative depression screening today. Reviewed local resources for support.  - Patient tobacco use? No.   - hx of drug use? No.    2. Infant care and feeding:  -Patient currently breastmilk feeding? No.  -Social determinants of health (SDOH) reviewed in EPIC. No concerns.   3. Sexuality, contraception and birth spacing - Patient does not want a pregnancy in the next year.  Desired family size is 3  children.  - Reviewed forms of contraception in tiered fashion. Patient desired bilateral tubal ligation which was done during C-section.   4. Sleep and fatigue -Encouraged family/partner/community support of 4 hrs of uninterrupted sleep to help with mood and fatigue  5. Physical Recovery  - Discussed patients delivery and complications. She describes her labor as good. - Patient had a C-section repeat; no problems after deliver. Patient expressed understanding - Patient has urinary incontinence? No. - Patient is safe to resume physical and sexual activity  6.  Health Maintenance - HM due items addressed Yes - Last pap smear  Diagnosis  Date Value Ref Range Status  04/03/2019   Final   - Negative for intraepithelial lesion or malignancy  (NILM)   Pap smear not done at today's visit.  -Breast Cancer screening indicated? No.   7. Chronic Disease/Pregnancy Condition follow up: Hypertension --Chronic hypertension: Not on medication prior to pregnancy. No longer taking norvasc with BP WNL today. Will cont to monitor without medication. Aware of pre-e s/sx.   - PCP follow up  Allayne Stack, DO [facility] [employer], [facility]

## 2020-08-24 ENCOUNTER — Ambulatory Visit (INDEPENDENT_AMBULATORY_CARE_PROVIDER_SITE_OTHER): Payer: Medicaid Other | Admitting: Family Medicine

## 2020-08-24 ENCOUNTER — Other Ambulatory Visit: Payer: Self-pay

## 2020-08-24 ENCOUNTER — Encounter: Payer: Self-pay | Admitting: Family Medicine

## 2020-08-24 VITALS — Ht 64.0 in | Wt 387.0 lb

## 2020-08-24 DIAGNOSIS — E785 Hyperlipidemia, unspecified: Secondary | ICD-10-CM

## 2020-08-24 DIAGNOSIS — Z0000 Encounter for general adult medical examination without abnormal findings: Secondary | ICD-10-CM | POA: Diagnosis not present

## 2020-08-24 DIAGNOSIS — Z131 Encounter for screening for diabetes mellitus: Secondary | ICD-10-CM

## 2020-08-24 DIAGNOSIS — M25562 Pain in left knee: Secondary | ICD-10-CM

## 2020-08-24 DIAGNOSIS — Z6841 Body Mass Index (BMI) 40.0 and over, adult: Secondary | ICD-10-CM | POA: Diagnosis not present

## 2020-08-24 DIAGNOSIS — G8929 Other chronic pain: Secondary | ICD-10-CM

## 2020-08-24 LAB — POCT GLYCOSYLATED HEMOGLOBIN (HGB A1C): Hemoglobin A1C: 4.6 % (ref 4.0–5.6)

## 2020-08-24 MED ORDER — DICLOFENAC SODIUM 1 % EX GEL: 4.0000 g | Freq: Four times a day (QID) | CUTANEOUS | 0 refills | Status: AC

## 2020-08-24 MED ORDER — METHYLPREDNISOLONE ACETATE 40 MG/ML IJ SUSP: 40.0000 mg | Freq: Once | INTRAMUSCULAR | Status: DC

## 2020-08-24 NOTE — Assessment & Plan Note (Signed)
Chronic left knee pain worsening. Likely 2/2 to OA and most likely exacerbated by patient's weight. BMI 66. Discussed treatment options for patient-one of them being weight loss, see below. I was going to a steroid shot in the clinic however did not feel comfortable due to the amount of adipose tissue surrounding the knee which was limited assessment of the bony landmarks. I explained that I would be happier if this was done at [facility]. They also offer gel shots which she might find beneficial. Pt was happy with this plan. Also ordered left knee x rays. Referred to pain clinic per patient's request. For the pain recommended tylenol and voltaren gel. She does not take NSAIDs due to the hx of gastric sleeve. Follow up with me as needed.

## 2020-08-24 NOTE — Assessment & Plan Note (Addendum)
Pt with morbid obesity with hx of gastric sleeve. Unable to prescribe wegovy to due backorder on this medication. A1c wnl today so cannot prescribe Ozempic/Trulicity. Limited role of exercise given left knee pain see below. Will have to be careful with diet changes as pt is breastfeeding her 4 week old son. Recommended HWW for further management.

## 2020-08-24 NOTE — Progress Notes (Addendum)
     SUBJECTIVE:   CHIEF COMPLAINT / HPI:   Danielle [name] is a 35 y.o. female presents for knee pain  Left knee pain  Left sided pain started in 2005. Has tried PT and it did not help. Has seen orthopedics who do not feel she is eligible for TRK due to her age. She has had steroid shots X 3 which did not help. She was previously seen in a pain clinic put her on: vicodin, celebrex, morphine etc which reduced her pain down to 80%.  Right now takes tylenol for the pain. Does not take NSAIDs due to hx of gastric sleeve.   Obesity Pt has had a long term hx of obesity. She gained most of her weight after the birth of her children. She was approx 260 lb prior to the birth of her first child. She has had a gastric sleeve in the past. For diet drinks mostly smoothies and vegetables now. She finds it hard to exercise due to the left knee pain, her weight and also having 3 young children.   Flowsheet Row Office Visit from 08/24/2020 in [facility]  PHQ-9 Total Score 0        PERTINENT  PMH / PSH: morbid obesity, alpha thalassemia  OBJECTIVE:   Ht 5\' 4"  (1.626 m)   Wt (!) 387 lb (175.5 kg)   BMI 66.43 kg/m    General: Alert, no acute distress Cardio: well perfused  Pulm: normal work of breathing Extremities: No peripheral edema.  Neuro: Cranial nerves grossly intact   Knee: - Inspection: no gross deformity. No swelling/effusion, erythema or bruising. Skin intact - Palpation: no TTP - ROM: full active ROM with flexion and extension in knee and hip - Strength: 5/5 strength - Neuro/vasc: NV intact - Special Tests: - LIGAMENTS: negative anterior and posterior drawer, negative Lachman's, no MCL or LCL laxity   Antalgic gait   ASSESSMENT/PLAN:   Left knee pain Chronic left knee pain worsening. Likely 2/2 to OA and most likely exacerbated by patient's weight. BMI 66. Discussed treatment options for patient-one of them being weight loss, see below. I was going to a  steroid shot in the clinic however did not feel comfortable due to the amount of adipose tissue surrounding the knee which was limited assessment of the bony landmarks. I explained that I would be happier if this was done at [facility]. They also offer gel shots which she might find beneficial. Pt was happy with this plan. Also ordered left knee x rays. Referred to pain clinic per patient's request. For the pain recommended tylenol and voltaren gel. She does not take NSAIDs due to the hx of gastric sleeve. Follow up with me as needed.   Morbid obesity with BMI of 60.0-69.9, adult (HCC) Pt with morbid obesity with hx of gastric sleeve. Unable to prescribe wegovy to due backorder on this medication. A1c wnl today so cannot prescribe Ozempic/Trulicity. Limited role of exercise given left knee pain see below. Will have to be careful with diet changes as pt is breastfeeding her 73 week old son. Recommended HWW for further management.   Health care maintenance Obtained BMP, A1c and lipid panel today.     9, MD PGY-3 [facility]

## 2020-08-24 NOTE — Patient Instructions (Addendum)
Thank you for coming to see me today. It was a pleasure. Today we discussed your left knee pain: -Recommend weight loss to reduce the load through your knees, please call healthy weight and wellness clinic. Ask for Dr Rinaldo Ratel  Address: [address] [place], [place], [place] [address] Confirmed by this business 6 weeks ago Phone: [phone]  I have placed an order for left knee x-rays.  Please go to [facility] at [employer] or at [facility] to have this completed.  You do not need an appointment, but if you would like to call them beforehand, their number is [phone].  We will contact you with your results afterwards.   We will get some labs today.  If they are abnormal or we need to do something about them, I will call you.  If they are normal, I will send you a message on MyChart (if it is active) or a letter in the mail.  If you don't hear from [place] in 2 weeks, please call the office at the number below.   I have placed a referral for sports medicine and also the pain clinic. They will call you for an appointment   Please follow-up with me as and when you need.   If you have any questions or concerns, please do not hesitate to call the office at [phone].  Best wishes,   Dr Allena Katz

## 2020-08-25 DIAGNOSIS — E785 Hyperlipidemia, unspecified: Secondary | ICD-10-CM | POA: Insufficient documentation

## 2020-08-25 DIAGNOSIS — Z0000 Encounter for general adult medical examination without abnormal findings: Secondary | ICD-10-CM | POA: Insufficient documentation

## 2020-08-25 LAB — BASIC METABOLIC PANEL
BUN/Creatinine Ratio: 18 (ref 9–23)
BUN: 12 mg/dL (ref 6–20)
CO2: 25 mmol/L (ref 20–29)
Calcium: 9.8 mg/dL (ref 8.7–10.2)
Chloride: 102 mmol/L (ref 96–106)
Creatinine, Ser: 0.68 mg/dL (ref 0.57–1.00)
Glucose: 79 mg/dL (ref 65–99)
Potassium: 4.7 mmol/L (ref 3.5–5.2)
Sodium: 142 mmol/L (ref 134–144)
eGFR: 116 mL/min/{1.73_m2} (ref >59)

## 2020-08-25 LAB — LIPID PANEL
Chol/HDL Ratio: 3.4 ratio (ref 0.0–4.4)
Cholesterol, Total: 205 mg/dL — ABNORMAL HIGH (ref 100–199)
HDL: 61 mg/dL (ref >39)
LDL Chol Calc (NIH): 130 mg/dL — ABNORMAL HIGH (ref 0–99)
Triglycerides: 80 mg/dL (ref 0–149)
VLDL Cholesterol Cal: 14 mg/dL (ref 5–40)

## 2020-08-25 NOTE — Assessment & Plan Note (Signed)
Obtained BMP, A1c and lipid panel today.

## 2020-09-29 ENCOUNTER — Encounter: Payer: Self-pay | Admitting: Physical Medicine and Rehabilitation

## 2020-12-28 ENCOUNTER — Encounter
Payer: Medicaid Other | Attending: Physical Medicine and Rehabilitation | Admitting: Physical Medicine and Rehabilitation

## 2021-03-18 ENCOUNTER — Ambulatory Visit (INDEPENDENT_AMBULATORY_CARE_PROVIDER_SITE_OTHER): Payer: Medicaid Other | Admitting: Family Medicine

## 2021-03-18 ENCOUNTER — Other Ambulatory Visit: Payer: Self-pay

## 2021-03-18 ENCOUNTER — Encounter: Payer: Self-pay | Admitting: Family Medicine

## 2021-03-18 VITALS — BP 129/85 | HR 92 | Ht 64.0 in | Wt 384.2 lb

## 2021-03-18 DIAGNOSIS — M25561 Pain in right knee: Secondary | ICD-10-CM | POA: Diagnosis not present

## 2021-03-18 DIAGNOSIS — T7491XA Unspecified adult maltreatment, confirmed, initial encounter: Secondary | ICD-10-CM
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
## 2021-03-18 NOTE — Patient Instructions (Addendum)
It was wonderful seeing you today.  I am sorry you are going through this difficult time.  Regarding your knee pain I want to get some imaging of your knee.  You may need physical therapy after that but we will wait for the imaging results first.  I have also sent a message to our therapist who should contact you regarding a possible appointment.  If you have any questions or concerns please call the clinic.  Please let me know if there is anything we can help you with. ?
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
## 2021-03-20 ENCOUNTER — Other Ambulatory Visit: Payer: Self-pay | Admitting: Family Medicine

## 2021-04-18 ENCOUNTER — Ambulatory Visit (HOSPITAL_COMMUNITY)
Admission: EM | Admit: 2021-04-18 | Discharge: 2021-04-18 | Disposition: A | Discharge status: Home / Self Care | Payer: Medicaid Other | Attending: Nurse Practitioner | Admitting: Nurse Practitioner

## 2021-04-18 ENCOUNTER — Encounter (HOSPITAL_COMMUNITY): Payer: Self-pay

## 2021-04-18 DIAGNOSIS — Z20818 Contact with and (suspected) exposure to other bacterial communicable diseases: Secondary | ICD-10-CM | POA: Diagnosis not present

## 2021-04-18 DIAGNOSIS — R220 Localized swelling, mass and lump, head: Secondary | ICD-10-CM | POA: Insufficient documentation

## 2021-04-18 DIAGNOSIS — Z20822 Contact with and (suspected) exposure to covid-19: Secondary | ICD-10-CM | POA: Diagnosis not present

## 2021-04-18 DIAGNOSIS — J069 Acute upper respiratory infection, unspecified: Secondary | ICD-10-CM | POA: Insufficient documentation

## 2021-04-18 DIAGNOSIS — J029 Acute pharyngitis, unspecified: Secondary | ICD-10-CM | POA: Insufficient documentation

## 2021-04-18 LAB — POCT RAPID STREP A, ED / UC: Streptococcus, Group A Screen (Direct): NEGATIVE

## 2021-04-18 NOTE — Discharge Instructions (Addendum)
-   We will let you know with positive results ?- Your symptoms and exam findings are most consistent with a viral upper respiratory infection. These usually run their course in 7-10 days. Some things that can make you feel better are: ?- Increased rest ?- Increasing fluid with water/sugar free electrolytes ?- Acetaminophen as needed for fever/pain.  ?- Salt water gargling, chloraseptic spray and throat lozenges ?- OTC guaifenesin (Mucinex).  ?- Saline sinus flushes or a neti pot.  ?- Humidifying the air. ? ?Please seek care if your symptoms worsen or do not improve over the next week. ? ?
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
## 2021-04-18 NOTE — ED Triage Notes (Signed)
Pt presents to office for sore throat. She is requesting a strep test.  ?

## 2021-04-19 LAB — SARS CORONAVIRUS 2 (TAT 6-24 HRS): SARS Coronavirus 2: NEGATIVE

## 2021-04-21 LAB — CULTURE, GROUP A STREP (THRC)

## 2021-06-21 ENCOUNTER — Encounter: Payer: Self-pay | Admitting: *Deleted

## 2021-07-16 DIAGNOSIS — Z419 Encounter for procedure for purposes other than remedying health state, unspecified: Secondary | ICD-10-CM | POA: Diagnosis not present

## 2021-07-26 ENCOUNTER — Encounter: Payer: Self-pay | Admitting: Family Medicine

## 2021-07-26 ENCOUNTER — Telehealth (INDEPENDENT_AMBULATORY_CARE_PROVIDER_SITE_OTHER): Payer: Medicaid Other | Admitting: Family Medicine

## 2021-07-26 DIAGNOSIS — F32A Depression, unspecified: Secondary | ICD-10-CM | POA: Diagnosis not present

## 2021-07-26 MED ORDER — CITALOPRAM HYDROBROMIDE 20 MG PO TABS: 20.0000 mg | ORAL_TABLET | Freq: Every day | ORAL | 3 refills | Status: DC

## 2021-07-26 MED ORDER — HYDROXYZINE HCL 25 MG PO TABS: 25.0000 mg | ORAL_TABLET | Freq: Three times a day (TID) | ORAL | 0 refills | Status: DC | PRN
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
## 2021-07-27 ENCOUNTER — Telehealth: Payer: Self-pay
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
## 2021-07-27 NOTE — Telephone Encounter (Signed)
   Telephone encounter was:  Successful.  07/27/2021 Name: Danielle [name] MRN: [id number] DOB: 09/12/1985  Danielle [name] is a 36 y.o. year old female who is a primary care patient of Shelby Mattocks, DO . The community resource team was consulted for assistance with Food Insecurity and Financial Difficulties related to financial strain  Care guide performed the following interventions: Patient provided with information about care guide support team and interviewed to confirm resource needs.Patient has 3 small children and lost her job. Patient needs assistance with food and paying for her bills until she can find work. I have mailed, emailed and added referrals in [email or web address]  Follow Up Plan:  Care guide will follow up with patient by phone over the next 2 weeks    [facility] Guide, Embedded Care Coordination [facility], Care Management  [phone] [address] [place], [place], [place] [address] Phone: [phone] Email: [email or web address]

## 2021-08-09 ENCOUNTER — Telehealth (INDEPENDENT_AMBULATORY_CARE_PROVIDER_SITE_OTHER): Payer: Medicaid Other | Admitting: Family Medicine

## 2021-08-09 ENCOUNTER — Encounter: Payer: Self-pay | Admitting: Family Medicine

## 2021-08-09 ENCOUNTER — Telehealth: Payer: Self-pay

## 2021-08-09 ENCOUNTER — Telehealth: Payer: Self-pay | Admitting: Student

## 2021-08-09 DIAGNOSIS — F32A Depression, unspecified: Secondary | ICD-10-CM

## 2021-08-09 MED ORDER — CITALOPRAM HYDROBROMIDE 40 MG PO TABS: 40.0000 mg | ORAL_TABLET | Freq: Every day | ORAL | 0 refills | Status: DC

## 2021-08-09 NOTE — Assessment & Plan Note (Addendum)
PHQ 9 5. GAD 7 4. No SI. Overall a little better compared to a few weeks ago. Recommended increasing celexa to 40mg . Continue hydroxyzine PRN for anxiety. Recommended pt finds a therapist on [email or web address] who accepts medicaid as I feel it will be very beneficial to her before she starts work again. Pt will drop off FMLA paperwork later this week and I will fill these out. She should follow up with Dr new PCP in 1 month to check in with regards to her mental health.

## 2021-08-09 NOTE — Telephone Encounter (Signed)
   Telephone encounter was:  Successful.  08/09/2021 Name: Reyanna Baley MRN: [id number] DOB: [birth year]  Gray Maugeri is a 36 y.o. year old female who is a primary care patient of Shelby Mattocks, DO . The community resource team was consulted for assistance with Food Insecurity  Care guide performed the following interventions: Patient provided with information about care guide support team and interviewed to confirm resource needs.Patient has not recieved the resources in the mail yet but she has my contact information if needed  Follow Up Plan:  No further follow up planned at this time. The patient has been provided with needed resources.   Lenard Forth Care Guide, Embedded Care Coordination [facility], Care Management  [phone] [address] [place], [place], [place] [address] Phone: [phone] Email: [email or web address]

## 2021-08-09 NOTE — Progress Notes (Signed)
[facility] Telemedicine Visit  Patient consented to have virtual visit and was identified by name and date of birth. Method of visit: Video  Encounter participants: Patient: Danielle [name] - located at home Provider: Towanda Octave - located at [facility] Others (if applicable):   Chief Complaint:  Depression   HPI:  Depression Overall a little better than a few weeks ago. Started Celexa 20mg  2 weeks ago. Tolerating well, no side effects. Hydroxyzine is helping for acute anxiety. Was able to contact her job and apply for short term disability. They have sent her paperwork. She is requesting 3 months disability. Her kids go back school on August 28th. Has found a new job which will start in October 23.      08/09/2021   10:05 AM 06/29/2020   10:15 AM 06/23/2020    2:08 PM 06/22/2020   10:00 AM  GAD 7 : Generalized Anxiety Score  Nervous, Anxious, on Edge 1 0 0 0  Control/stop worrying 0 0 0 0  Worry too much - different things 0 0 0 0  Trouble relaxing 1 0 0 0  Restless 0 0 0 0  Easily annoyed or irritable 2 0 0 0  Afraid - awful might happen 0 0 0 0  Total GAD 7 Score 4 0 0 0  Anxiety Difficulty Somewhat difficult           08/09/2021   10:03 AM 07/26/2021    1:38 PM 03/18/2021    8:55 AM 08/24/2020   10:51 AM 06/29/2020   10:15 AM  Depression screen PHQ 2/9  Decreased Interest 1 1 0 0 0  Down, Depressed, Hopeless 1 1 0 0 0  PHQ - 2 Score 2 2 0 0 0  Altered sleeping 0 0 0 0 0  Tired, decreased energy 1 0 0 0 0  Change in appetite 1 0 0 0 0  Feeling bad or failure about yourself  0 0 0 0 0  Trouble concentrating 0  0 0 0  Moving slowly or fidgety/restless 1 0 0 0 0  Suicidal thoughts 0 0 0 0 0  PHQ-9 Score 5 2 0 0 0  Difficult doing work/chores Very difficult Somewhat difficult  Not difficult at all      ROS: per HPI  Pertinent PMHx: depression, obesity  Exam:  Wt (!) 370 lb (167.8 kg) Comment: pt reports  LMP 07/20/2021 (Exact Date)   BMI 63.51  kg/m   Respiratory: speaking in full sentences   Assessment/Plan:  Depression PHQ 9 5. GAD 7 4. No SI. Overall a little better compared to a few weeks ago. Recommended increasing celexa to 40mg . Continue hydroxyzine PRN for anxiety. Recommended pt finds a therapist on [email or web address] who accepts medicaid as I feel it will be very beneficial to her before she starts work again. Pt will drop off FMLA paperwork later this week and I will fill these out. She should follow up with Dr 09/20/2021 new PCP in 1 month to check in with regards to her mental health.    Time spent during visit with patient: 15 minutes

## 2021-08-09 NOTE — Telephone Encounter (Signed)
Patient dropped off form at front desk for Short- Term Disability and FMLA .  Verified that patient section of form has been completed.  Last DOS/WCC with PCP was 08/09/21.  Placed form in green team folder to be completed by clinical staff.  Danielle [name]

## 2021-08-11 NOTE — Telephone Encounter (Signed)
Clinical info completed on FMLA/Short Term Disability form.  Placed form in Dr. Delaney Meigs box for completion since Dr Allena Katz will be finishing in the next few days.  Will route to Dr. Allena Katz as well as she has recently seen pt via My Chart visits and is very familiar with patient and could assist or complete forms.   When form is completed, please route note to "RN Team" and place in wall pocket in front office.   Emerson Barretto Zimmerman Rumple, CMA

## 2021-08-11 NOTE — Telephone Encounter (Signed)
Thank you April! Don't worry Dr Royal Piedra I will take care of it tomorrow.

## 2021-08-14 ENCOUNTER — Encounter: Payer: Self-pay | Admitting: Family Medicine

## 2021-08-15 ENCOUNTER — Telehealth: Payer: Self-pay | Admitting: Family Medicine

## 2021-08-15 NOTE — Telephone Encounter (Signed)
Called pt to go through some things on her FMLA paperwork. I will fax it later to her work later today.

## 2021-08-15 NOTE — Telephone Encounter (Signed)
Completed FMLA paperwork and placed in RN box. Please fax to patient's employer. Thank you.

## 2021-08-16 DIAGNOSIS — Z419 Encounter for procedure for purposes other than remedying health state, unspecified: Secondary | ICD-10-CM | POA: Diagnosis not present

## 2021-08-16 NOTE — Telephone Encounter (Signed)
Faxed to [phone]. Copy made and placed in batch scanning.   [name], RN

## 2021-09-02 DIAGNOSIS — F411 Generalized anxiety disorder: Secondary | ICD-10-CM | POA: Diagnosis not present

## 2021-09-02 DIAGNOSIS — F330 Major depressive disorder, recurrent, mild: Secondary | ICD-10-CM | POA: Diagnosis not present

## 2021-09-14 DIAGNOSIS — F411 Generalized anxiety disorder: Secondary | ICD-10-CM | POA: Diagnosis not present

## 2021-09-14 DIAGNOSIS — F330 Major depressive disorder, recurrent, mild: Secondary | ICD-10-CM | POA: Diagnosis not present

## 2021-09-16 DIAGNOSIS — Z419 Encounter for procedure for purposes other than remedying health state, unspecified: Secondary | ICD-10-CM | POA: Diagnosis not present

## 2021-10-07 DIAGNOSIS — F330 Major depressive disorder, recurrent, mild: Secondary | ICD-10-CM | POA: Diagnosis not present

## 2021-10-07 DIAGNOSIS — F411 Generalized anxiety disorder: Secondary | ICD-10-CM | POA: Diagnosis not present

## 2021-10-16 DIAGNOSIS — Z419 Encounter for procedure for purposes other than remedying health state, unspecified: Secondary | ICD-10-CM | POA: Diagnosis not present

## 2021-10-21 DIAGNOSIS — F411 Generalized anxiety disorder: Secondary | ICD-10-CM | POA: Diagnosis not present

## 2021-10-21 DIAGNOSIS — F330 Major depressive disorder, recurrent, mild: Secondary | ICD-10-CM | POA: Diagnosis not present

## 2021-10-21 DIAGNOSIS — F4312 Post-traumatic stress disorder, chronic: Secondary | ICD-10-CM | POA: Diagnosis not present

## 2021-10-24 DIAGNOSIS — F411 Generalized anxiety disorder: Secondary | ICD-10-CM | POA: Diagnosis not present

## 2021-10-24 DIAGNOSIS — F4312 Post-traumatic stress disorder, chronic: Secondary | ICD-10-CM | POA: Diagnosis not present

## 2021-10-24 DIAGNOSIS — F330 Major depressive disorder, recurrent, mild: Secondary | ICD-10-CM | POA: Diagnosis not present

## 2021-11-03 IMAGING — US US OB COMP LESS 14 WK
1 series · 15 of 28 positions shown · non-contrast
Comparison: None.

CLINICAL DATA: Vaginal bleeding.  Positive beta HCG

EXAM:
OBSTETRIC <14 WK US AND TRANSVAGINAL OB US
TECHNIQUE: Both transabdominal and transvaginal ultrasound examinations were
performed for complete evaluation of the gestation as well as the
maternal uterus, adnexal regions, and pelvic cul-de-sac.
Transvaginal technique was performed to assess early pregnancy.

[Series 1: us ob comp less 14 wk · 51 acquisitions, 15 frames shown]
[im 1/51]
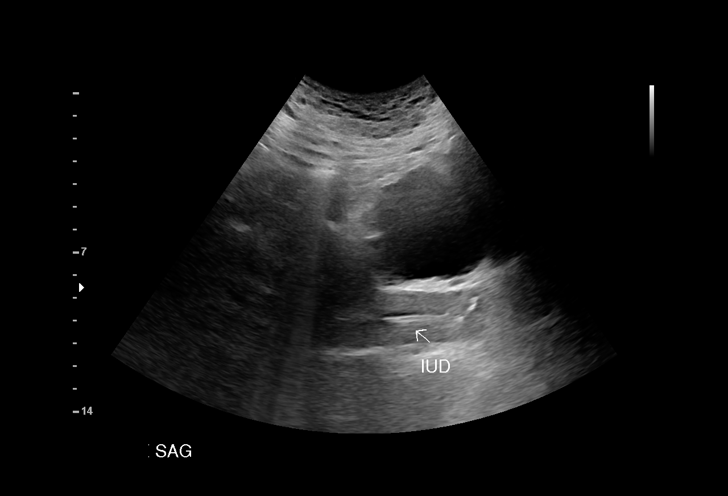
[im 4/51]
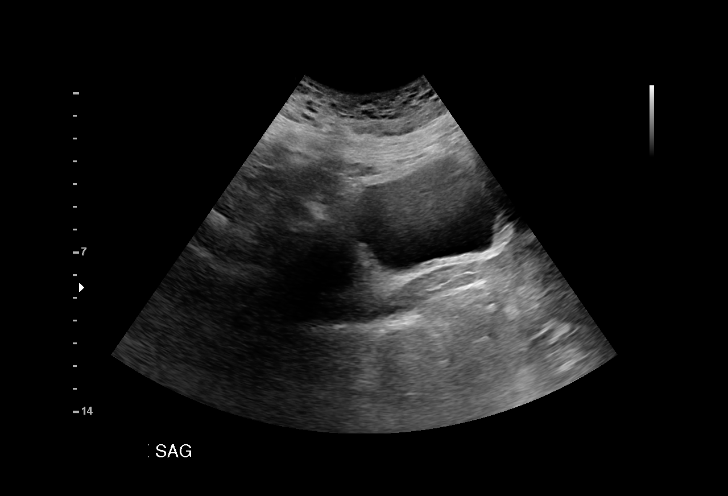
[im 8/51]
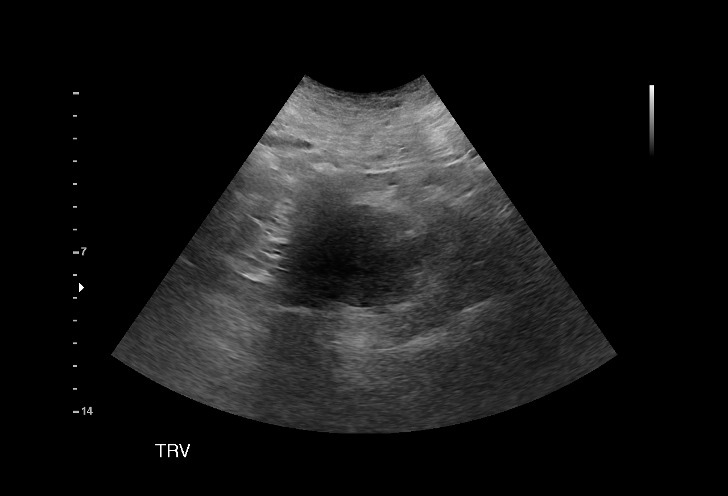
[im 12/51]
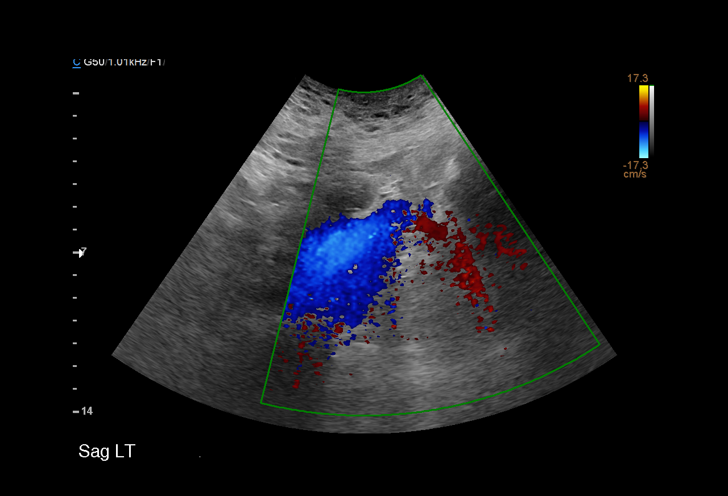
[im 15/51]
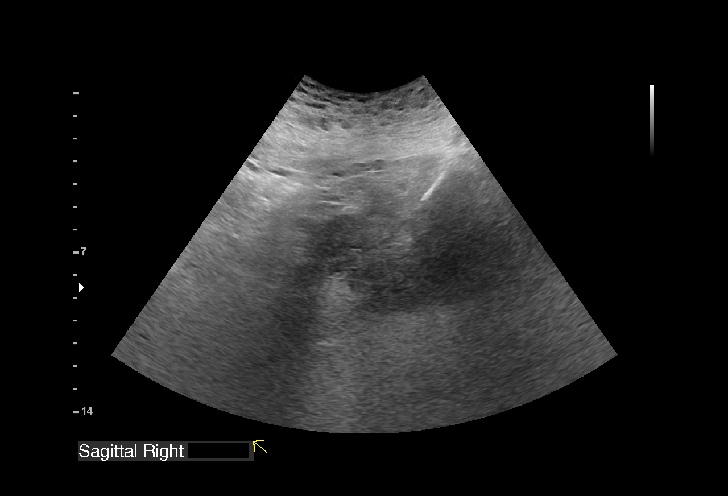
[im 19/51]
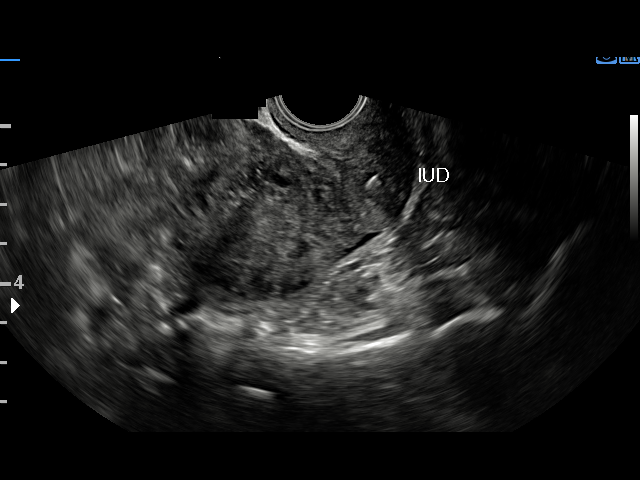
[im 23/51]
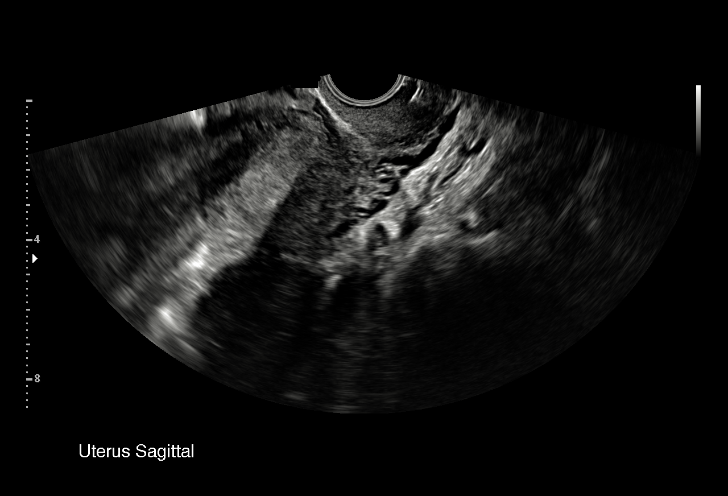
[im 26/51]
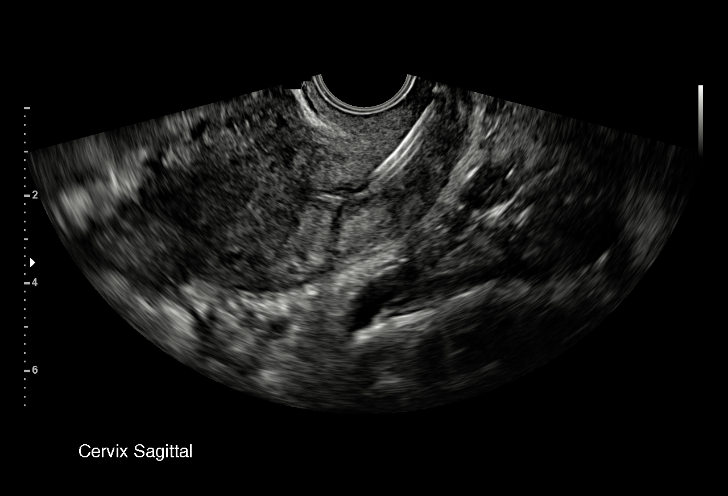
[im 28/51]
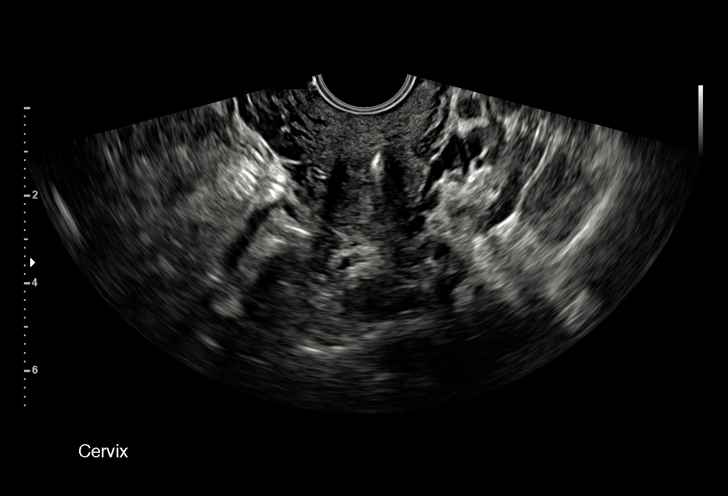
[im 32/51]
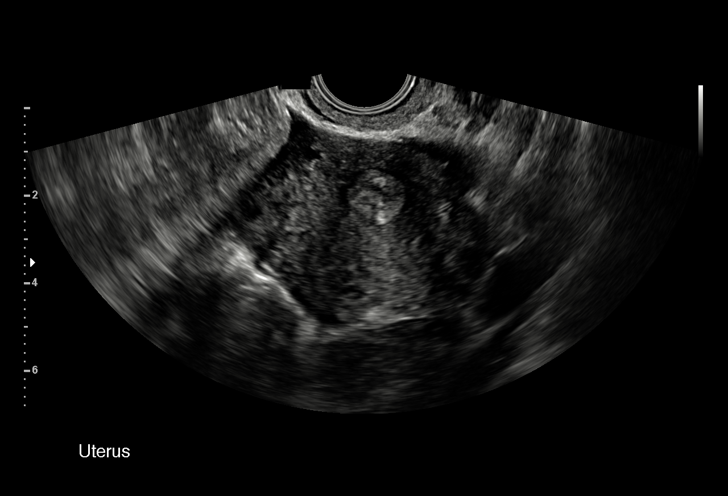
[im 36/51]
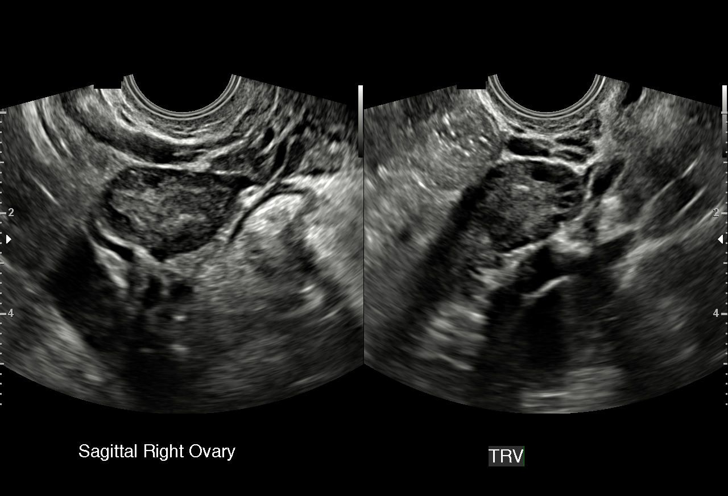
[im 39/51]
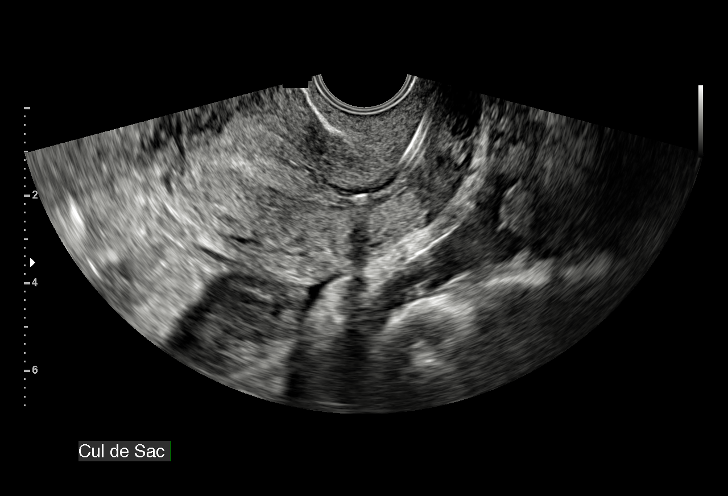
[im 43/51]
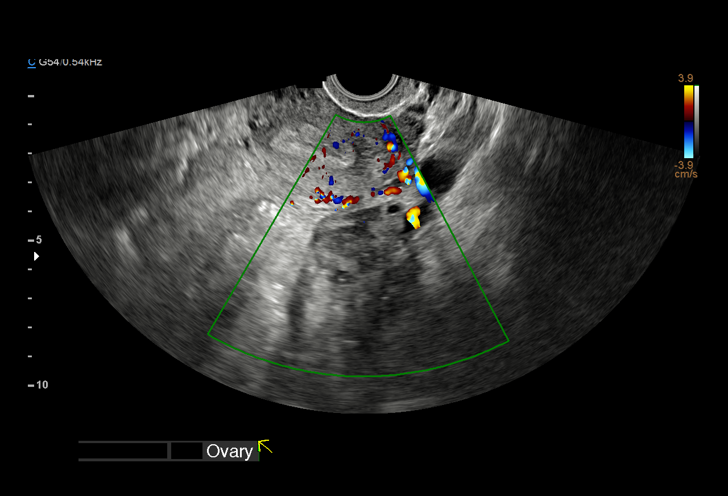
[im 47/51]
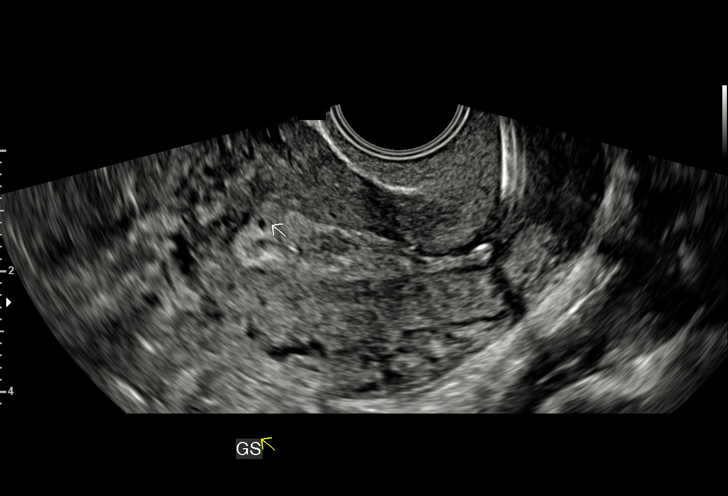
[im 51/51]
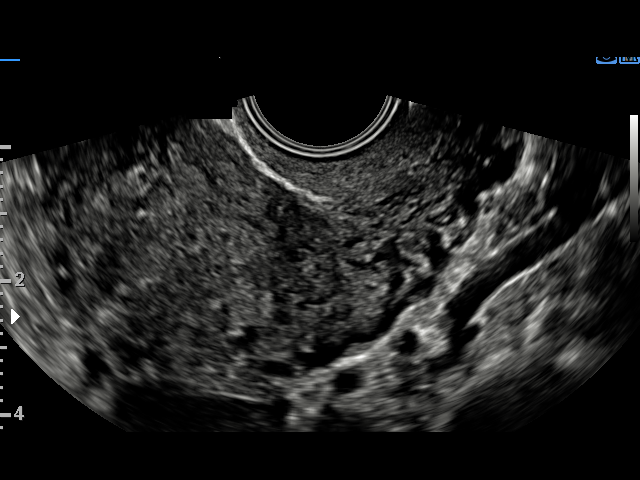

[15 of 28 positions shown; findings below may reference images not displayed]

FINDINGS: Intrauterine gestational sac: Probable tiny gestational sac within
the endometrium

Yolk sac:  Not visualized

Embryo:  Not visualized

Cardiac Activity: Not visualized

MSD: Just over 1 mm consistent with a less than 5 week gestation

Subchorionic hemorrhage:  None visualized.

Maternal uterus/adnexae: Intrauterine device is positioned at the
uterus-cervix junction. Atrium is mildly thickened.

Right ovary measures 2.7 x 2.3 x 1.8 cm. Left ovary measures 2.2 x
5.7 x 3.5 cm. No free pelvic fluid.
IMPRESSION: 1. Probable early intrauterine gestational sac, but no yolk sac,
fetal pole, or cardiac activity yet visualized. Recommend follow-up
quantitative B-HCG levels and follow-up US in 14 days to assess
viability. This recommendation follows SRU consensus guidelines:
Diagnostic Criteria for Nonviable Pregnancy Early in the First
Trimester. N Engl J Med 3985; [DATE]. Based on suspected
gestational sac size, estimated gestational age is less than 5
weeks.

2. Intrauterine device at the uterus-cervix junction. Endometrium
mildly thickened.

3.  No appreciable extrauterine pelvic mass or free fluid.

## 2021-11-04 DIAGNOSIS — F411 Generalized anxiety disorder: Secondary | ICD-10-CM | POA: Diagnosis not present

## 2021-11-04 DIAGNOSIS — F330 Major depressive disorder, recurrent, mild: Secondary | ICD-10-CM | POA: Diagnosis not present

## 2021-11-04 DIAGNOSIS — F4312 Post-traumatic stress disorder, chronic: Secondary | ICD-10-CM | POA: Diagnosis not present

## 2021-11-10 DIAGNOSIS — F411 Generalized anxiety disorder: Secondary | ICD-10-CM | POA: Diagnosis not present

## 2021-11-10 DIAGNOSIS — F4312 Post-traumatic stress disorder, chronic: Secondary | ICD-10-CM | POA: Diagnosis not present

## 2021-11-10 DIAGNOSIS — F330 Major depressive disorder, recurrent, mild: Secondary | ICD-10-CM | POA: Diagnosis not present

## 2021-11-16 DIAGNOSIS — Z419 Encounter for procedure for purposes other than remedying health state, unspecified: Secondary | ICD-10-CM | POA: Diagnosis not present

## 2021-11-30 DIAGNOSIS — F4312 Post-traumatic stress disorder, chronic: Secondary | ICD-10-CM | POA: Diagnosis not present

## 2021-11-30 DIAGNOSIS — F411 Generalized anxiety disorder: Secondary | ICD-10-CM | POA: Diagnosis not present

## 2021-11-30 DIAGNOSIS — F330 Major depressive disorder, recurrent, mild: Secondary | ICD-10-CM | POA: Diagnosis not present

## 2021-12-14 DIAGNOSIS — F411 Generalized anxiety disorder: Secondary | ICD-10-CM | POA: Diagnosis not present

## 2021-12-14 DIAGNOSIS — F4312 Post-traumatic stress disorder, chronic: Secondary | ICD-10-CM | POA: Diagnosis not present

## 2021-12-14 DIAGNOSIS — F330 Major depressive disorder, recurrent, mild: Secondary | ICD-10-CM | POA: Diagnosis not present

## 2021-12-16 DIAGNOSIS — Z419 Encounter for procedure for purposes other than remedying health state, unspecified: Secondary | ICD-10-CM | POA: Diagnosis not present

## 2021-12-30 DIAGNOSIS — F4312 Post-traumatic stress disorder, chronic: Secondary | ICD-10-CM | POA: Diagnosis not present

## 2021-12-30 DIAGNOSIS — F330 Major depressive disorder, recurrent, mild: Secondary | ICD-10-CM | POA: Diagnosis not present

## 2021-12-30 DIAGNOSIS — F411 Generalized anxiety disorder: Secondary | ICD-10-CM | POA: Diagnosis not present

## 2022-01-16 DIAGNOSIS — Z419 Encounter for procedure for purposes other than remedying health state, unspecified: Secondary | ICD-10-CM | POA: Diagnosis not present

## 2022-01-26 DIAGNOSIS — F411 Generalized anxiety disorder: Secondary | ICD-10-CM | POA: Diagnosis not present

## 2022-01-26 DIAGNOSIS — F330 Major depressive disorder, recurrent, mild: Secondary | ICD-10-CM | POA: Diagnosis not present

## 2022-01-26 DIAGNOSIS — F4312 Post-traumatic stress disorder, chronic: Secondary | ICD-10-CM | POA: Diagnosis not present

## 2022-02-13 ENCOUNTER — Encounter: Payer: Self-pay | Admitting: Family Medicine

## 2022-02-13 ENCOUNTER — Ambulatory Visit (INDEPENDENT_AMBULATORY_CARE_PROVIDER_SITE_OTHER): Payer: Medicaid Other | Admitting: Family Medicine

## 2022-02-13 VITALS — BP 132/82 | HR 83 | Wt 378.8 lb

## 2022-02-13 DIAGNOSIS — F32A Depression, unspecified: Secondary | ICD-10-CM | POA: Diagnosis not present

## 2022-02-13 DIAGNOSIS — Z23 Encounter for immunization: Secondary | ICD-10-CM | POA: Diagnosis not present

## 2022-02-13 DIAGNOSIS — Z0000 Encounter for general adult medical examination without abnormal findings: Secondary | ICD-10-CM

## 2022-02-13 DIAGNOSIS — Z6841 Body Mass Index (BMI) 40.0 and over, adult: Secondary | ICD-10-CM | POA: Diagnosis not present

## 2022-02-13 NOTE — Assessment & Plan Note (Signed)
COVID and flu vaccines today tolerated well, no adverse events. No prior history of adverse events.
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
## 2022-02-13 NOTE — Assessment & Plan Note (Signed)
Patient desires weight loss medication. Will collect labs to assess for insulin resistance to try for GLP-1 injectable. If denied, can try metformin. Discussed healthy weight and wellness clinic referral.
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
## 2022-02-14 ENCOUNTER — Encounter: Payer: Self-pay | Admitting: Family Medicine

## 2022-02-14 ENCOUNTER — Other Ambulatory Visit: Payer: Self-pay | Admitting: Family Medicine

## 2022-02-14 DIAGNOSIS — F411 Generalized anxiety disorder: Secondary | ICD-10-CM | POA: Diagnosis not present

## 2022-02-14 DIAGNOSIS — G4733 Obstructive sleep apnea (adult) (pediatric): Secondary | ICD-10-CM

## 2022-02-14 DIAGNOSIS — O10919 Unspecified pre-existing hypertension complicating pregnancy, unspecified trimester: Secondary | ICD-10-CM

## 2022-02-14 DIAGNOSIS — E88819 Insulin resistance, unspecified: Secondary | ICD-10-CM | POA: Insufficient documentation

## 2022-02-14 DIAGNOSIS — F4312 Post-traumatic stress disorder, chronic: Secondary | ICD-10-CM | POA: Diagnosis not present

## 2022-02-14 DIAGNOSIS — E785 Hyperlipidemia, unspecified: Secondary | ICD-10-CM

## 2022-02-14 DIAGNOSIS — L680 Hirsutism: Secondary | ICD-10-CM

## 2022-02-14 DIAGNOSIS — F330 Major depressive disorder, recurrent, mild: Secondary | ICD-10-CM | POA: Diagnosis not present

## 2022-02-14 LAB — GLUCOSE, RANDOM: Glucose: 80 mg/dL (ref 70–99)

## 2022-02-14 LAB — LIPID PANEL
Chol/HDL Ratio: 3.2 ratio (ref 0.0–4.4)
Cholesterol, Total: 152 mg/dL (ref 100–199)
HDL: 47 mg/dL (ref >39)
LDL Chol Calc (NIH): 90 mg/dL (ref 0–99)
Triglycerides: 75 mg/dL (ref 0–149)
VLDL Cholesterol Cal: 15 mg/dL (ref 5–40)

## 2022-02-14 LAB — HEMOGLOBIN A1C
Est. average glucose Bld gHb Est-mCnc: 94 mg/dL
Hgb A1c MFr Bld: 4.9 % (ref 4.8–5.6)

## 2022-02-14 LAB — INSULIN, RANDOM: INSULIN: 10.7 u[IU]/mL (ref 2.6–24.9)

## 2022-02-14 MED ORDER — SEMAGLUTIDE(0.25 OR 0.5MG/DOS) 2 MG/3ML ~~LOC~~ SOPN: 0.2500 mg | PEN_INJECTOR | SUBCUTANEOUS | 0 refills | Status: DC

## 2022-02-14 NOTE — Progress Notes (Signed)
Given insulin resistance on labs, will submit prescription for GLP-1 with that diagnosis.   Start Ozempic 0.25 mg weekly subcu x 4 weeks. If tolerated, will increase to 0.5 mg weekly.   Ezequiel Essex, MD

## 2022-02-16 DIAGNOSIS — Z419 Encounter for procedure for purposes other than remedying health state, unspecified: Secondary | ICD-10-CM | POA: Diagnosis not present

## 2022-02-17 ENCOUNTER — Telehealth: Payer: Self-pay

## 2022-02-17 NOTE — Telephone Encounter (Signed)
A Prior Authorization was initiated for this patients OZEMPIC through CoverMyMeds.   Key: [id number]

## 2022-02-21 NOTE — Telephone Encounter (Signed)
Prior Auth for patients medication OZEMPIC denied by [facility] MEDICAID via CoverMyMeds.   Reason: THE HEALTH PLAN DOES NOT COVER THIS DRUG FOR WEIGHTLOSS  CoverMyMeds Key: [id number]  Denial letter scanned to media.

## 2022-02-22 ENCOUNTER — Encounter: Payer: Self-pay | Admitting: Family Medicine

## 2022-02-22 MED ORDER — METFORMIN HCL ER 500 MG PO TB24: 1000.0000 mg | ORAL_TABLET | Freq: Two times a day (BID) | ORAL | 3 refills | Status: DC

## 2022-02-22 NOTE — Addendum Note (Signed)
Addended by: Renard Hamper on: 02/22/2022 04:30 PM   Modules accepted: Orders

## 2022-02-23 ENCOUNTER — Other Ambulatory Visit: Payer: Self-pay | Admitting: Family Medicine

## 2022-02-23 NOTE — Progress Notes (Signed)
Referral to bariatric surgery per patient request.  Ezequiel Essex, MD

## 2022-02-28 MED ORDER — METFORMIN HCL ER 500 MG PO TB24: 1000.0000 mg | ORAL_TABLET | Freq: Two times a day (BID) | ORAL | 3 refills | Status: DC

## 2022-03-14 DIAGNOSIS — F411 Generalized anxiety disorder: Secondary | ICD-10-CM | POA: Diagnosis not present

## 2022-03-14 DIAGNOSIS — F330 Major depressive disorder, recurrent, mild: Secondary | ICD-10-CM | POA: Diagnosis not present

## 2022-03-14 DIAGNOSIS — F4312 Post-traumatic stress disorder, chronic: Secondary | ICD-10-CM | POA: Diagnosis not present

## 2022-03-17 DIAGNOSIS — Z419 Encounter for procedure for purposes other than remedying health state, unspecified: Secondary | ICD-10-CM | POA: Diagnosis not present

## 2022-04-13 DIAGNOSIS — F4312 Post-traumatic stress disorder, chronic: Secondary | ICD-10-CM | POA: Diagnosis not present

## 2022-04-13 DIAGNOSIS — F330 Major depressive disorder, recurrent, mild: Secondary | ICD-10-CM | POA: Diagnosis not present

## 2022-04-13 DIAGNOSIS — F411 Generalized anxiety disorder: Secondary | ICD-10-CM | POA: Diagnosis not present

## 2022-04-17 DIAGNOSIS — Z419 Encounter for procedure for purposes other than remedying health state, unspecified: Secondary | ICD-10-CM | POA: Diagnosis not present

## 2022-05-04 ENCOUNTER — Telehealth: Payer: Self-pay | Admitting: Student

## 2022-05-04 NOTE — Telephone Encounter (Signed)
LVM for patient to call PCP to schedule apt. AS, CMA

## 2022-05-26 DIAGNOSIS — F411 Generalized anxiety disorder: Secondary | ICD-10-CM | POA: Diagnosis not present

## 2022-05-26 DIAGNOSIS — F4312 Post-traumatic stress disorder, chronic: Secondary | ICD-10-CM | POA: Diagnosis not present

## 2022-05-26 DIAGNOSIS — F330 Major depressive disorder, recurrent, mild: Secondary | ICD-10-CM | POA: Diagnosis not present

## 2022-06-14 ENCOUNTER — Emergency Department (HOSPITAL_COMMUNITY)
Admission: EM | Admit: 2022-06-14 | Discharge: 2022-06-14 | Disposition: A | Discharge status: Home / Self Care | Payer: Medicaid Other | Attending: Emergency Medicine | Admitting: Emergency Medicine

## 2022-06-14 ENCOUNTER — Emergency Department (HOSPITAL_COMMUNITY): Payer: Medicaid Other

## 2022-06-14 ENCOUNTER — Encounter (HOSPITAL_COMMUNITY): Payer: Self-pay | Admitting: Emergency Medicine

## 2022-06-14 ENCOUNTER — Other Ambulatory Visit: Payer: Self-pay

## 2022-06-14 DIAGNOSIS — M5442 Lumbago with sciatica, left side: Secondary | ICD-10-CM | POA: Diagnosis not present

## 2022-06-14 DIAGNOSIS — M545 Low back pain, unspecified: Secondary | ICD-10-CM | POA: Diagnosis not present

## 2022-06-14 LAB — I-STAT BETA HCG BLOOD, ED (MC, WL, AP ONLY): I-stat hCG, quantitative: <5 m[IU]/mL (ref <5)

## 2022-06-14 MED ORDER — MORPHINE SULFATE (PF) 4 MG/ML IV SOLN
4.0000 mg | Freq: Once | INTRAVENOUS | Status: AC
  Administered 2022-06-14: 4 mg via INTRAMUSCULAR
  Filled 2022-06-14: qty 1

## 2022-06-14 MED ORDER — CYCLOBENZAPRINE HCL 10 MG PO TABS: 10.0000 mg | ORAL_TABLET | Freq: Three times a day (TID) | ORAL | 0 refills | Status: AC | PRN

## 2022-06-14 MED ORDER — CYCLOBENZAPRINE HCL 10 MG PO TABS
10.0000 mg | ORAL_TABLET | Freq: Once | ORAL | Status: AC
  Administered 2022-06-14: 10 mg via ORAL
  Filled 2022-06-14: qty 1

## 2022-06-14 MED ORDER — OXYCODONE HCL 5 MG PO TABS: 5.0000 mg | ORAL_TABLET | Freq: Four times a day (QID) | ORAL | 0 refills | Status: AC | PRN

## 2022-06-14 MED ORDER — DEXAMETHASONE SODIUM PHOSPHATE 10 MG/ML IJ SOLN
10.0000 mg | Freq: Once | INTRAMUSCULAR | Status: AC
  Administered 2022-06-14: 10 mg via INTRAMUSCULAR
  Filled 2022-06-14: qty 1

## 2022-06-14 MED ORDER — ACETAMINOPHEN 325 MG PO TABS: 650.0000 mg | ORAL_TABLET | Freq: Four times a day (QID) | ORAL | 0 refills | Status: AC | PRN

## 2022-06-14 MED ORDER — ONDANSETRON 4 MG PO TBDP
4.0000 mg | ORAL_TABLET | Freq: Once | ORAL | Status: AC
  Administered 2022-06-14: 4 mg via ORAL
  Filled 2022-06-14: qty 1

## 2022-06-14 MED ORDER — LIDOCAINE 5 % EX PTCH
1.0000 | MEDICATED_PATCH | CUTANEOUS | Status: DC
  Administered 2022-06-14: 1 via TRANSDERMAL
  Filled 2022-06-14: qty 1

## 2022-06-14 MED ORDER — LIDOCAINE 5 % EX PTCH: 1.0000 | MEDICATED_PATCH | CUTANEOUS | 0 refills | Status: AC

## 2022-06-14 MED ORDER — PREDNISONE 10 MG PO TABS: 40.0000 mg | ORAL_TABLET | Freq: Every day | ORAL | 0 refills | Status: AC

## 2022-06-14 NOTE — ED Triage Notes (Signed)
Pt reports lower back radiating to left leg that's been going on for the last several months. Pt reports pain has progressively worsened. VSS

## 2022-06-14 NOTE — ED Provider Notes (Signed)
[place] EMERGENCY DEPARTMENT AT [facility] Provider Note   CSN: [id number] Arrival date & time: 06/14/22  1408     History  Chief Complaint  Patient presents with   Back Pain   Hip Pain   Leg Pain    Danielle [name] is a 37 y.o. female with past medical history osteoarthritis of the knee presents to the ED today complaining of lower back pain for the last 3 weeks.  She states that most of the pain is on the left side of her back and radiates down the left leg making it difficult to walk.  She denies weakness in the legs, fever, saddle anesthesia, bowel or bladder dysfunction, urinary symptoms, abdominal pain, vomiting, diarrhea, paresthesias, or other complaints. No fall or injury to back.  No history of IV drug use or malignancy.    Home Medications Prior to Admission medications   Medication Sig Start Date End Date Taking? Authorizing Provider  acetaminophen (TYLENOL) 325 MG tablet Take 2 tablets (650 mg total) by mouth every 6 (six) hours as needed for up to 5 days for moderate pain or mild pain. 06/14/22 06/19/22 Yes Thierno Hun L, PA-C  cyclobenzaprine (FLEXERIL) 10 MG tablet Take 1 tablet (10 mg total) by mouth 3 (three) times daily as needed for up to 5 days for muscle spasms. 06/14/22 06/19/22 Yes Javel Hersh L, PA-C  lidocaine (LIDODERM) 5 % Place 1 patch onto the skin daily for 5 days. Remove & Discard patch within 12 hours or as directed by MD 06/14/22 06/19/22 Yes Samaa Ueda L, PA-C  oxyCODONE (ROXICODONE) 5 MG immediate release tablet Take 1 tablet (5 mg total) by mouth every 6 (six) hours as needed for up to 5 days for severe pain or breakthrough pain. 06/14/22 06/19/22 Yes Dailen Mcclish L, PA-C  predniSONE (DELTASONE) 10 MG tablet Take 4 tablets (40 mg total) by mouth daily for 5 days. 06/14/22 06/19/22 Yes Deniro Laymon L, PA-C  citalopram (CELEXA) 40 MG tablet Take 1 tablet (40 mg total) by mouth daily. 08/09/21 11/07/21  Cathleen Corti, MD  coconut oil  OIL Apply 1 application topically as needed. 07/06/20   Bernerd Limbo, CNM  hydrOXYzine (ATARAX) 25 MG tablet Take 1 tablet (25 mg total) by mouth 3 (three) times daily as needed. 07/26/21   Cathleen Corti, MD  Iron, Ferrous Gluconate, 256 (28 Fe) MG TABS Take 1 tablet by mouth every other day. 05/05/20   Levie Heritage, DO  metFORMIN (GLUCOPHAGE-XR) 500 MG 24 hr tablet Take 2 tablets (1,000 mg total) by mouth 2 (two) times daily with a meal. Start with one tablet once daily for the first several days to assess tolerance. Gradually increase to 2 pills twice daily (2 with breakfast, 2 with dinner). 02/28/22   Fayette Pho, MD  Prenatal MV & Min w/FA-DHA (PRENATAL GUMMIES PO) Take 1 capsule by mouth daily.    [provider]      Allergies    Nsaids    Review of Systems   Review of Systems  All other systems reviewed and are negative.   Physical Exam Updated Vital Signs BP 138/84 (BP Location: Right Arm)   Pulse 72   Temp 98.3 F (36.8 C) (Oral)   Resp 18   SpO2 99%  Physical Exam Vitals and nursing note reviewed.  Constitutional:      General: She is not in acute distress.    Appearance: Normal appearance. She is not ill-appearing or toxic-appearing.  HENT:     Head: Normocephalic and atraumatic.     Mouth/Throat:     Mouth: Mucous membranes are moist.  Eyes:     General: No scleral icterus.    Conjunctiva/sclera: Conjunctivae normal.  Neck:     Comments: No meningismus Cardiovascular:     Rate and Rhythm: Normal rate and regular rhythm.     Heart sounds: No murmur heard. Pulmonary:     Effort: Pulmonary effort is normal.     Breath sounds: Normal breath sounds.  Abdominal:     General: Abdomen is flat. There is no distension.     Palpations: Abdomen is soft.     Tenderness: There is no abdominal tenderness. There is no right CVA tenderness, left CVA tenderness, guarding or rebound.  Musculoskeletal:        General: No deformity. Normal range of  motion.     Cervical back: Normal range of motion and neck supple. No rigidity.     Right lower leg: No edema.     Left lower leg: No edema.     Comments: No midline CTL spinal tenderness, step-offs, or deformities, equal strength of bilateral lower extremities, moderate tenderness over paraspinous muscle of lumbar region of back on the left  Skin:    General: Skin is warm and dry.     Capillary Refill: Capillary refill takes less than 2 seconds.  Neurological:     Mental Status: She is alert. Mental status is at baseline.  Psychiatric:        Behavior: Behavior normal.     ED Results / Procedures / Treatments   Labs (all labs ordered are listed, but only abnormal results are displayed) Labs Reviewed  I-STAT BETA HCG BLOOD, ED ([facility], [facility], [facility] ONLY)    EKG None  Radiology DG Lumbar Spine 2-3 Views  Result Date: 06/14/2022 CLINICAL DATA:  Low back pain. EXAM: LUMBAR SPINE - 2-3 VIEW COMPARISON:  None Available. FINDINGS: There is mild lumbar levorotary scoliosis, apex L2-3, and a slight grade 1 likely discogenic type anterolisthesis at L3-4 and L4-5. No traumatic or further listhesis is seen. There are 5 lumbar type segments. The discs are degenerated from L3-4 through L5-S1, with spondylosis. There is slight disc space loss also seen at L1-2 and L2-3 and mild facet hypertrophy from L3-4 inferiorly. The SI joints are unremarkable, as visualized. No evidence of fractures is not seen. IMPRESSION: Levorotary scoliosis and degenerative changes without evidence of fractures. Slight anterolisthesis at L3-4 and L4-5 is likely discogenic. Electronically Signed   By: Almira Bar M.D.   On: 06/14/2022 22:44    Procedures Procedures    Medications Ordered in ED Medications  lidocaine (LIDODERM) 5 % 1 patch (1 patch Transdermal Patch Applied 06/14/22 2031)  morphine (PF) 4 MG/ML injection 4 mg (4 mg Intramuscular Given 06/14/22 2030)  ondansetron (ZOFRAN-ODT) disintegrating tablet 4 mg (4 mg  Oral Given 06/14/22 2028)  dexamethasone (DECADRON) injection 10 mg (10 mg Intramuscular Given 06/14/22 2030)  cyclobenzaprine (FLEXERIL) tablet 10 mg (10 mg Oral Given 06/14/22 2027)    ED Course/ Medical Decision Making/ A&P                             Medical Decision Making Amount and/or Complexity of Data Reviewed Radiology: ordered. Decision-making details documented in ED Course.  Risk OTC drugs. Prescription drug management.   Medical Decision Making:   Tyahna Shover is a 37 y.o. female  who presented to the ED today with back pain detailed above.    Patient's presentation is complicated by their history of obesity.  Complete initial physical exam performed, notably the patient  was in NAD. Nonfocal neuro exam. Tenderness over lumbar region left paraspinous musculature.  No midline spinal tenderness, step-offs, or deformities.  Moving all 4 extremities equally and spontaneously.  No meningismus.  Patient nontoxic-appearing.    Reviewed and confirmed nursing documentation for past medical history, family history, social history.    Initial Assessment:   With the patient's presentation of back pain, the emergent differential diagnosis for back pain includes but is not limited to fracture, muscle strain, cauda equina, spinal stenosis, DDD, ankylosing spondylitis, acute ligamentous injury, disk herniation, spondylolisthesis, epidural compression syndrome, metastatic cancer, transverse myelitis, vertebral osteomyelitis, diskitis, kidney stone, pyelonephritis, AAA, Perforated ulcer, retrocecal appendicitis, pancreatitis, bowel obstruction, retroperitoneal hemorrhage or mass, meningitis.   Initial Plan:  Screening labs including CBC and Metabolic panel to evaluate for infectious or metabolic etiology of disease.  Urinalysis with reflex culture ordered to evaluate for UTI or relevant urologic/nephrologic pathology.  CXR to evaluate for structural/infectious intrathoracic pathology.  EKG  to evaluate for cardiac pathology Symptomatic management Objective evaluation as reviewed   Initial Study Results:   Radiology:  All images reviewed independently. Agree with radiology report at this time.   DG Lumbar Spine 2-3 Views  Result Date: 06/14/2022 CLINICAL DATA:  Low back pain. EXAM: LUMBAR SPINE - 2-3 VIEW COMPARISON:  None Available. FINDINGS: There is mild lumbar levorotary scoliosis, apex L2-3, and a slight grade 1 likely discogenic type anterolisthesis at L3-4 and L4-5. No traumatic or further listhesis is seen. There are 5 lumbar type segments. The discs are degenerated from L3-4 through L5-S1, with spondylosis. There is slight disc space loss also seen at L1-2 and L2-3 and mild facet hypertrophy from L3-4 inferiorly. The SI joints are unremarkable, as visualized. No evidence of fractures is not seen. IMPRESSION: Levorotary scoliosis and degenerative changes without evidence of fractures. Slight anterolisthesis at L3-4 and L4-5 is likely discogenic. Electronically Signed   By: Almira Bar M.D.   On: 06/14/2022 22:44      Final Assessment and Plan:   Patient presents to ED c/o back pain. No red flag symptoms. No history of malignancy or IV drug use. No severe trauma to the back. History sounds consistent with sciatica.  Patient with history of chronic left knee pain.  No history of problems with the back.  No injury to the back.  Nonfocal neuroexam.  No midline spinal tenderness.  Does have tenderness over the left paraspinous musculature of the lumbar region.  Good strength to bilateral lower extremities.  Range of motion intact.  Lumbar films shows degenerative changes.  Patient nontoxic-appearing.  Vital signs stable. Good pain relief with medications given in ED. PDMP reviewed, no current prescriptions for narcotic pain medications. Will give small amount oxycodone in addition to other multimodal therapy while pt awaits outpatient follow up given pain severity. Pt agreeable with  plan. Strict ED return precautions given, all questions answered, and stable for discharge.    Clinical Impression:  1. Acute left-sided low back pain with left-sided sciatica      Discharge           Final Clinical Impression(s) / ED Diagnoses Final diagnoses:  Acute left-sided low back pain with left-sided sciatica    Rx / DC Orders ED Discharge Orders          Ordered  cyclobenzaprine (FLEXERIL) 10 MG tablet  3 times daily PRN        06/14/22 2253    acetaminophen (TYLENOL) 325 MG tablet  Every 6 hours PRN        06/14/22 2253    lidocaine (LIDODERM) 5 %  Every 24 hours        06/14/22 2253    predniSONE (DELTASONE) 10 MG tablet  Daily        06/14/22 2253    oxyCODONE (ROXICODONE) 5 MG immediate release tablet  Every 6 hours PRN        06/14/22 2253              Danielle Lederer, PA-C 06/14/22 2315    Rolan Bucco, MD 06/14/22 2333

## 2022-06-14 NOTE — Discharge Instructions (Addendum)
Thank you for letting us take care of you today.  We see degenerative changes on your x-ray today. Your symptoms sound consistent with sciatica. I am prescribing multiple medications to help with this. I recommend taking the non-narcotic medications on a scheduled basis over the next 2-3 days to help decrease your level of pain. For any severe, breakthrough pain, you may take the oxycodone as needed.   Follow up with your PCP (or a primary clinic provided or one of your own choosing) for further management of your back pain. I also recommend mentioning this to your pain management specialist when you are able to schedule an appointment.  For any new or worsening symptoms such as weakness in your legs, fever, chest pain, difficulty breathing, loss of bowel or bladder control, or other new, concerning symptoms, please return to nearest ED for re-evaluation.

## 2022-06-17 DIAGNOSIS — Z419 Encounter for procedure for purposes other than remedying health state, unspecified: Secondary | ICD-10-CM | POA: Diagnosis not present

## 2022-06-22 DIAGNOSIS — F331 Major depressive disorder, recurrent, moderate: Secondary | ICD-10-CM | POA: Diagnosis not present

## 2022-06-27 DIAGNOSIS — M25562 Pain in left knee: Secondary | ICD-10-CM | POA: Diagnosis not present

## 2022-06-27 DIAGNOSIS — M25552 Pain in left hip: Secondary | ICD-10-CM | POA: Diagnosis not present

## 2022-06-27 DIAGNOSIS — F331 Major depressive disorder, recurrent, moderate: Secondary | ICD-10-CM | POA: Diagnosis not present

## 2022-06-27 DIAGNOSIS — G8929 Other chronic pain: Secondary | ICD-10-CM | POA: Diagnosis not present

## 2022-06-27 DIAGNOSIS — M5442 Lumbago with sciatica, left side: Secondary | ICD-10-CM | POA: Diagnosis not present

## 2022-07-04 DIAGNOSIS — F331 Major depressive disorder, recurrent, moderate: Secondary | ICD-10-CM | POA: Diagnosis not present

## 2022-07-10 ENCOUNTER — Other Ambulatory Visit: Payer: Self-pay | Admitting: Physician Assistant

## 2022-07-10 ENCOUNTER — Ambulatory Visit
Admission: RE | Admit: 2022-07-10 | Discharge: 2022-07-10 | Disposition: A | Discharge status: Home / Self Care | Payer: Medicaid Other | Source: Ambulatory Visit | Attending: Physician Assistant | Admitting: Physician Assistant

## 2022-07-10 DIAGNOSIS — M25562 Pain in left knee: Secondary | ICD-10-CM

## 2022-07-10 DIAGNOSIS — M25552 Pain in left hip: Secondary | ICD-10-CM

## 2022-07-11 DIAGNOSIS — F331 Major depressive disorder, recurrent, moderate: Secondary | ICD-10-CM | POA: Diagnosis not present

## 2022-07-13 DIAGNOSIS — G8929 Other chronic pain: Secondary | ICD-10-CM | POA: Diagnosis not present

## 2022-07-13 DIAGNOSIS — M5416 Radiculopathy, lumbar region: Secondary | ICD-10-CM | POA: Diagnosis not present

## 2022-07-13 DIAGNOSIS — M5442 Lumbago with sciatica, left side: Secondary | ICD-10-CM | POA: Diagnosis not present

## 2022-07-13 DIAGNOSIS — M1712 Unilateral primary osteoarthritis, left knee: Secondary | ICD-10-CM | POA: Diagnosis not present

## 2022-07-13 DIAGNOSIS — M25562 Pain in left knee: Secondary | ICD-10-CM | POA: Diagnosis not present

## 2022-07-13 DIAGNOSIS — Z79891 Long term (current) use of opiate analgesic: Secondary | ICD-10-CM | POA: Diagnosis not present

## 2022-07-17 DIAGNOSIS — Z419 Encounter for procedure for purposes other than remedying health state, unspecified: Secondary | ICD-10-CM | POA: Diagnosis not present

## 2022-07-21 DIAGNOSIS — F331 Major depressive disorder, recurrent, moderate: Secondary | ICD-10-CM | POA: Diagnosis not present

## 2022-07-24 ENCOUNTER — Ambulatory Visit
Admission: EM | Admit: 2022-07-24 | Discharge: 2022-07-24 | Disposition: A | Discharge status: Home / Self Care | Payer: Medicaid Other | Attending: Internal Medicine | Admitting: Internal Medicine

## 2022-07-24 DIAGNOSIS — B349 Viral infection, unspecified: Secondary | ICD-10-CM | POA: Diagnosis not present

## 2022-07-24 DIAGNOSIS — F411 Generalized anxiety disorder: Secondary | ICD-10-CM | POA: Diagnosis not present

## 2022-07-24 DIAGNOSIS — Z20822 Contact with and (suspected) exposure to covid-19: Secondary | ICD-10-CM | POA: Diagnosis not present
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
## 2022-07-24 NOTE — Discharge Instructions (Addendum)
The clinical contact you with results of the COVID test done today if positive.  Lots of rest and fluids.  You may take over-the-counter cough medicine, Tylenol or ibuprofen as needed.  Please follow-up with your PCP if your symptoms do not improve.  Please go to the emergency room for any worsening symptoms.  I hope you feel better soon!

## 2022-07-24 NOTE — ED Triage Notes (Signed)
Pt presents with c/o being  COVID exposure. States she had diarrhea this morning and a headache since Saturday.   States she has  not taken any meds for relief.

## 2022-07-25 LAB — SARS CORONAVIRUS 2 (TAT 6-24 HRS): SARS Coronavirus 2: NEGATIVE

## 2022-07-31 DIAGNOSIS — F411 Generalized anxiety disorder: Secondary | ICD-10-CM | POA: Diagnosis not present

## 2022-08-07 DIAGNOSIS — F411 Generalized anxiety disorder: Secondary | ICD-10-CM | POA: Diagnosis not present

## 2022-08-08 ENCOUNTER — Other Ambulatory Visit: Payer: Self-pay | Admitting: Nurse Practitioner

## 2022-08-08 DIAGNOSIS — Z79891 Long term (current) use of opiate analgesic: Secondary | ICD-10-CM | POA: Diagnosis not present

## 2022-08-08 DIAGNOSIS — M25562 Pain in left knee: Secondary | ICD-10-CM | POA: Diagnosis not present

## 2022-08-08 DIAGNOSIS — M5442 Lumbago with sciatica, left side: Secondary | ICD-10-CM | POA: Diagnosis not present

## 2022-08-08 DIAGNOSIS — M5416 Radiculopathy, lumbar region: Secondary | ICD-10-CM

## 2022-08-08 DIAGNOSIS — M1712 Unilateral primary osteoarthritis, left knee: Secondary | ICD-10-CM | POA: Diagnosis not present

## 2022-08-08 DIAGNOSIS — G8929 Other chronic pain: Secondary | ICD-10-CM | POA: Diagnosis not present

## 2022-08-12 ENCOUNTER — Ambulatory Visit
Admission: RE | Admit: 2022-08-12 | Discharge: 2022-08-12 | Disposition: A | Discharge status: Home / Self Care | Payer: Medicaid Other | Source: Ambulatory Visit | Attending: Nurse Practitioner | Admitting: Nurse Practitioner

## 2022-08-12 DIAGNOSIS — M5416 Radiculopathy, lumbar region: Secondary | ICD-10-CM

## 2022-08-14 DIAGNOSIS — F411 Generalized anxiety disorder: Secondary | ICD-10-CM | POA: Diagnosis not present

## 2022-08-17 DIAGNOSIS — Z419 Encounter for procedure for purposes other than remedying health state, unspecified: Secondary | ICD-10-CM | POA: Diagnosis not present

## 2022-08-21 ENCOUNTER — Ambulatory Visit: Payer: Medicaid Other | Admitting: Student

## 2022-08-21 ENCOUNTER — Ambulatory Visit (INDEPENDENT_AMBULATORY_CARE_PROVIDER_SITE_OTHER): Payer: Medicaid Other | Admitting: Student

## 2022-08-21 VITALS — BP 128/86 | HR 96 | Ht 64.0 in | Wt 380.2 lb

## 2022-08-21 DIAGNOSIS — R110 Nausea: Secondary | ICD-10-CM | POA: Diagnosis not present

## 2022-08-21 DIAGNOSIS — M47816 Spondylosis without myelopathy or radiculopathy, lumbar region: Secondary | ICD-10-CM | POA: Diagnosis not present

## 2022-08-21 DIAGNOSIS — F411 Generalized anxiety disorder: Secondary | ICD-10-CM | POA: Diagnosis not present

## 2022-08-21 DIAGNOSIS — Z6841 Body Mass Index (BMI) 40.0 and over, adult: Secondary | ICD-10-CM | POA: Diagnosis not present

## 2022-08-21 LAB — POCT URINE PREGNANCY: Preg Test, Ur: NEGATIVE

## 2022-08-21 NOTE — Progress Notes (Signed)
  SUBJECTIVE:   CHIEF COMPLAINT / HPI:   Notes she has arthritis in her back and sciatica. She is seeing pain management to help with the pain. She has been unable to walk and some days she is in her bed and can't move. She has brought in paperwork because some days she cannot get out of bed due to pain. She also has trouble getting to work on time because she has to park further away from the building and can't move fast enough. She recently fell on the job towards the end of June as well rushing to get into work. She is requesting paperwork for intermittent tardiness and extra time to get into the building and absence. She does not currently use any assistive devices to walk. The paperwork is going to be dated from 06/26/22 until now.   Notes she has an MRI on 8/14 and pain management appointment with [facility] on 8/20.   She currently takes gabapentin 300mg  TID, Norco 5-325mg  as needed. She currently has a temporary handicap placard via pain medicine.   After visit, patient endorses frequent nausea. Requests pregnancy test.  Agreed to return in the future for this separate issue.  PERTINENT  PMH / PSH: OSA  Patient Care Team: Shelby Mattocks, DO as PCP - General (Family Medicine) OBJECTIVE:  BP 128/86   Pulse 96   Ht 5\' 4"  (1.626 m)   Wt (!) 380 lb 3.2 oz (172.5 kg)   LMP 07/01/2022 (Exact Date)   SpO2 100%   BMI 65.26 kg/m  Gen: no acute distress Psych: normal affect and mood, appropriately conversational  ASSESSMENT/PLAN:  Osteoarthritis of lumbar spine, unspecified spinal osteoarthritis complication status Assessment & Plan: I have reviewed prior imaging.  Management of pain via [facility]. MRI 8/14 and next appointment 8/20. I have declined long-term handicap placard at this time.  Unfortunately, this is a complicated picture.  She works at a call center for [employer].  She is capable of doing this work at home.  She struggles with getting to work on time and  sometimes getting to work given the significance of her low back pain.  Ultimately, degenerative arthritis is drastically affected by obesity. Exercise and regular movement would be greatly beneficial although pain control has been challenging.  I have informed her that I will complete her paperwork regarding if she is capable of doing her job however ultimately I do suspect that she will need to come to an agreement with her job on which work duties she is able to complete at home.   Morbid obesity with BMI of 60.0-69.9, adult [facility]) Assessment & Plan: She requests bariatric surgery referral.  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  I have heavily advised setting up again with nutritionist.  Ultimately, problems will continue to persist and worsen until this underlying factor has been significantly improved upon.  Orders: -     Amb Referral to Bariatric Surgery  Nausea -     POCT urine pregnancy  Shelby Mattocks, DO 08/23/2022, 9:02 AM PGY-3, [facility]

## 2022-08-21 NOTE — Patient Instructions (Signed)
I will work on completing this paperwork.  As we discussed, a long-term placard for handicap parking would not be appropriate at this time.  I will place a referral for bariatric surgery.  I highly recommend you establish with a nutritionist, please let me know if you need assistance with this matter.  I would like to see you back for your concern of nausea when you are able.

## 2022-08-22 DIAGNOSIS — M47816 Spondylosis without myelopathy or radiculopathy, lumbar region: Secondary | ICD-10-CM | POA: Insufficient documentation

## 2022-08-22 NOTE — Assessment & Plan Note (Signed)
Management of pain via [facility]. MRI 8/14 and next appointment 8/20. I have declined long-term handicap placard at this time

## 2022-08-23 NOTE — Assessment & Plan Note (Signed)
She requests bariatric surgery referral.  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  I have heavily advised setting up again with nutritionist.  Ultimately, problems will continue to persist and worsen until this underlying factor has been significantly improved upon.

## 2022-08-25 ENCOUNTER — Telehealth: Payer: Self-pay

## 2022-08-25 NOTE — Telephone Encounter (Signed)
Received the following message from Dr. Royal Piedra.   I have placed paperwork in RN triage box. Please scan paperwork into chart and notify patient she may pick this up. Please notify patient that I would recommend she also have Pain medicine involved as well. Many of these questions in terms of long-term prognosis and treatment plan are not being managed by me so my answers may not be the most beneficial for her work, but I have completed what I was able. I also do not have access to their notes and would appreciate her signing a release form for that information.    Called patient and advised of provider message. She will sign release of information when she picks up the forms.   Copy made and placed in batch scanning.   [name], RN

## 2022-08-28 DIAGNOSIS — F411 Generalized anxiety disorder: Secondary | ICD-10-CM | POA: Diagnosis not present

## 2022-08-30 ENCOUNTER — Ambulatory Visit
Admission: RE | Admit: 2022-08-30 | Discharge: 2022-08-30 | Disposition: A | Discharge status: Home / Self Care | Payer: Medicaid Other | Source: Ambulatory Visit | Attending: Nurse Practitioner | Admitting: Nurse Practitioner

## 2022-08-30 DIAGNOSIS — M47897 Other spondylosis, lumbosacral region: Secondary | ICD-10-CM | POA: Diagnosis not present

## 2022-09-04 DIAGNOSIS — F411 Generalized anxiety disorder: Secondary | ICD-10-CM | POA: Diagnosis not present

## 2022-09-05 DIAGNOSIS — Z79891 Long term (current) use of opiate analgesic: Secondary | ICD-10-CM | POA: Diagnosis not present

## 2022-09-05 DIAGNOSIS — M25562 Pain in left knee: Secondary | ICD-10-CM | POA: Diagnosis not present

## 2022-09-05 DIAGNOSIS — M5416 Radiculopathy, lumbar region: Secondary | ICD-10-CM | POA: Diagnosis not present

## 2022-09-05 DIAGNOSIS — G8929 Other chronic pain: Secondary | ICD-10-CM | POA: Diagnosis not present

## 2022-09-05 DIAGNOSIS — M5442 Lumbago with sciatica, left side: Secondary | ICD-10-CM | POA: Diagnosis not present

## 2022-09-05 DIAGNOSIS — M1712 Unilateral primary osteoarthritis, left knee: Secondary | ICD-10-CM | POA: Diagnosis not present

## 2022-09-11 DIAGNOSIS — F411 Generalized anxiety disorder: Secondary | ICD-10-CM | POA: Diagnosis not present

## 2022-09-17 DIAGNOSIS — Z419 Encounter for procedure for purposes other than remedying health state, unspecified: Secondary | ICD-10-CM | POA: Diagnosis not present

## 2022-09-17 DIAGNOSIS — F411 Generalized anxiety disorder: Secondary | ICD-10-CM | POA: Diagnosis not present

## 2022-09-25 DIAGNOSIS — F411 Generalized anxiety disorder: Secondary | ICD-10-CM | POA: Diagnosis not present

## 2022-09-28 ENCOUNTER — Ambulatory Visit (INDEPENDENT_AMBULATORY_CARE_PROVIDER_SITE_OTHER): Payer: Medicaid Other | Admitting: Student

## 2022-09-28 ENCOUNTER — Encounter: Payer: Self-pay | Admitting: Student

## 2022-09-28 VITALS — BP 119/81 | HR 99 | Ht 64.0 in | Wt 388.4 lb

## 2022-09-28 DIAGNOSIS — M47816 Spondylosis without myelopathy or radiculopathy, lumbar region: Secondary | ICD-10-CM | POA: Diagnosis not present

## 2022-09-28 DIAGNOSIS — Z23 Encounter for immunization: Secondary | ICD-10-CM | POA: Diagnosis not present

## 2022-09-28 NOTE — Progress Notes (Signed)
  SUBJECTIVE:   CHIEF COMPLAINT / HPI:   Patient presents today for additional assistance regarding paperwork.  She has an appointment with them on 9/17.   Requesting paperwork needs to strictly say she will be out a couple hours a day (maybe 2) so 10 hours per week 40 hours per month.  Additionally, needs to provide the frequency and duration and excuse absences from 6/10 and going forward.  She has until 9/20 to have this paperwork completed.  She was told by her spine specialist that they cannot complete the paperwork because they do not complete paperwork for this.  PERTINENT  PMH / PSH: OSA  OBJECTIVE:  BP 119/81   Pulse 99   Ht 5\' 4"  (1.626 m)   Wt (!) 388 lb 6.4 oz (176.2 kg)   SpO2 99%   BMI 66.67 kg/m  General: Well-appearing, NAD  ASSESSMENT/PLAN:   Assessment & Plan Osteoarthritis of lumbar spine, unspecified spinal osteoarthritis complication status No changes, still being managed by [facility].  I will complete paperwork to assist her with her job.   No follow-ups on file. Shelby Mattocks, DO 09/29/2022, 8:39 AM PGY-3, [facility]

## 2022-09-29 NOTE — Assessment & Plan Note (Addendum)
No changes, still being managed by [facility].  I will complete paperwork to assist her with her job.

## 2022-10-03 ENCOUNTER — Telehealth: Payer: Self-pay

## 2022-10-03 DIAGNOSIS — M47816 Spondylosis without myelopathy or radiculopathy, lumbar region: Secondary | ICD-10-CM | POA: Diagnosis not present

## 2022-10-03 DIAGNOSIS — M1712 Unilateral primary osteoarthritis, left knee: Secondary | ICD-10-CM | POA: Diagnosis not present

## 2022-10-03 DIAGNOSIS — G8929 Other chronic pain: Secondary | ICD-10-CM | POA: Diagnosis not present

## 2022-10-03 DIAGNOSIS — Z79891 Long term (current) use of opiate analgesic: Secondary | ICD-10-CM | POA: Diagnosis not present

## 2022-10-03 DIAGNOSIS — M545 Low back pain, unspecified: Secondary | ICD-10-CM | POA: Diagnosis not present

## 2022-10-03 DIAGNOSIS — M25562 Pain in left knee: Secondary | ICD-10-CM | POA: Diagnosis not present

## 2022-10-03 NOTE — Telephone Encounter (Signed)
Received staff message from PCP that paperwork is completed and ready for pickup.   Called patient and informed. Copy made and placed in batch scanning. Original at front desk for pick up.   [name], RN

## 2022-10-06 DIAGNOSIS — F411 Generalized anxiety disorder: Secondary | ICD-10-CM | POA: Diagnosis not present

## 2022-10-09 DIAGNOSIS — F411 Generalized anxiety disorder: Secondary | ICD-10-CM | POA: Diagnosis not present

## 2022-10-16 DIAGNOSIS — F411 Generalized anxiety disorder: Secondary | ICD-10-CM | POA: Diagnosis not present

## 2022-10-17 DIAGNOSIS — Z419 Encounter for procedure for purposes other than remedying health state, unspecified: Secondary | ICD-10-CM | POA: Diagnosis not present

## 2022-10-23 DIAGNOSIS — M25562 Pain in left knee: Secondary | ICD-10-CM | POA: Diagnosis not present

## 2022-10-23 DIAGNOSIS — F411 Generalized anxiety disorder: Secondary | ICD-10-CM | POA: Diagnosis not present

## 2022-10-30 DIAGNOSIS — F411 Generalized anxiety disorder: Secondary | ICD-10-CM | POA: Diagnosis not present

## 2022-11-06 DIAGNOSIS — F411 Generalized anxiety disorder: Secondary | ICD-10-CM | POA: Diagnosis not present

## 2022-11-08 DIAGNOSIS — G8929 Other chronic pain: Secondary | ICD-10-CM | POA: Diagnosis not present

## 2022-11-08 DIAGNOSIS — M47816 Spondylosis without myelopathy or radiculopathy, lumbar region: Secondary | ICD-10-CM | POA: Diagnosis not present

## 2022-11-08 DIAGNOSIS — M1712 Unilateral primary osteoarthritis, left knee: Secondary | ICD-10-CM | POA: Diagnosis not present

## 2022-11-08 DIAGNOSIS — M25562 Pain in left knee: Secondary | ICD-10-CM | POA: Diagnosis not present

## 2022-11-08 DIAGNOSIS — M545 Low back pain, unspecified: Secondary | ICD-10-CM | POA: Diagnosis not present

## 2022-11-08 DIAGNOSIS — Z79891 Long term (current) use of opiate analgesic: Secondary | ICD-10-CM | POA: Diagnosis not present

## 2022-11-13 DIAGNOSIS — F411 Generalized anxiety disorder: Secondary | ICD-10-CM | POA: Diagnosis not present

## 2022-11-17 DIAGNOSIS — Z419 Encounter for procedure for purposes other than remedying health state, unspecified: Secondary | ICD-10-CM | POA: Diagnosis not present

## 2022-11-21 DIAGNOSIS — F411 Generalized anxiety disorder: Secondary | ICD-10-CM | POA: Diagnosis not present

## 2022-11-27 DIAGNOSIS — F411 Generalized anxiety disorder: Secondary | ICD-10-CM | POA: Diagnosis not present

## 2022-12-03 DIAGNOSIS — F411 Generalized anxiety disorder: Secondary | ICD-10-CM | POA: Diagnosis not present

## 2022-12-07 DIAGNOSIS — Z79891 Long term (current) use of opiate analgesic: Secondary | ICD-10-CM | POA: Diagnosis not present

## 2022-12-07 DIAGNOSIS — G8929 Other chronic pain: Secondary | ICD-10-CM | POA: Diagnosis not present

## 2022-12-07 DIAGNOSIS — M1712 Unilateral primary osteoarthritis, left knee: Secondary | ICD-10-CM | POA: Diagnosis not present

## 2022-12-07 DIAGNOSIS — M47816 Spondylosis without myelopathy or radiculopathy, lumbar region: Secondary | ICD-10-CM | POA: Diagnosis not present

## 2022-12-08 NOTE — Therapy (Incomplete)
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

## 2022-12-09 ENCOUNTER — Ambulatory Visit: Payer: Medicaid Other | Attending: Nurse Practitioner

## 2022-12-11 DIAGNOSIS — F411 Generalized anxiety disorder: Secondary | ICD-10-CM | POA: Diagnosis not present

## 2022-12-17 DIAGNOSIS — Z419 Encounter for procedure for purposes other than remedying health state, unspecified: Secondary | ICD-10-CM | POA: Diagnosis not present

## 2022-12-18 DIAGNOSIS — F411 Generalized anxiety disorder: Secondary | ICD-10-CM | POA: Diagnosis not present

## 2022-12-24 DIAGNOSIS — F411 Generalized anxiety disorder: Secondary | ICD-10-CM | POA: Diagnosis not present

## 2023-01-12 DIAGNOSIS — M47816 Spondylosis without myelopathy or radiculopathy, lumbar region: Secondary | ICD-10-CM | POA: Diagnosis not present

## 2023-01-12 DIAGNOSIS — M1712 Unilateral primary osteoarthritis, left knee: Secondary | ICD-10-CM | POA: Diagnosis not present

## 2023-01-12 DIAGNOSIS — M5416 Radiculopathy, lumbar region: Secondary | ICD-10-CM | POA: Diagnosis not present

## 2023-01-17 DIAGNOSIS — Z419 Encounter for procedure for purposes other than remedying health state, unspecified: Secondary | ICD-10-CM | POA: Diagnosis not present

## 2023-01-25 DIAGNOSIS — G8929 Other chronic pain: Secondary | ICD-10-CM | POA: Diagnosis not present

## 2023-01-25 DIAGNOSIS — M1712 Unilateral primary osteoarthritis, left knee: Secondary | ICD-10-CM | POA: Diagnosis not present

## 2023-01-25 DIAGNOSIS — M47816 Spondylosis without myelopathy or radiculopathy, lumbar region: Secondary | ICD-10-CM | POA: Diagnosis not present

## 2023-02-16 DIAGNOSIS — M47816 Spondylosis without myelopathy or radiculopathy, lumbar region: Secondary | ICD-10-CM | POA: Diagnosis not present

## 2023-02-17 DIAGNOSIS — Z419 Encounter for procedure for purposes other than remedying health state, unspecified: Secondary | ICD-10-CM | POA: Diagnosis not present

## 2023-02-20 DIAGNOSIS — M1712 Unilateral primary osteoarthritis, left knee: Secondary | ICD-10-CM | POA: Diagnosis not present

## 2023-02-20 DIAGNOSIS — M5416 Radiculopathy, lumbar region: Secondary | ICD-10-CM | POA: Diagnosis not present

## 2023-02-20 DIAGNOSIS — M47816 Spondylosis without myelopathy or radiculopathy, lumbar region: Secondary | ICD-10-CM | POA: Diagnosis not present

## 2023-02-23 DIAGNOSIS — F331 Major depressive disorder, recurrent, moderate: Secondary | ICD-10-CM | POA: Diagnosis not present

## 2023-02-26 DIAGNOSIS — F411 Generalized anxiety disorder: Secondary | ICD-10-CM | POA: Diagnosis not present

## 2023-02-28 DIAGNOSIS — F411 Generalized anxiety disorder: Secondary | ICD-10-CM | POA: Diagnosis not present

## 2023-03-02 DIAGNOSIS — M5416 Radiculopathy, lumbar region: Secondary | ICD-10-CM | POA: Diagnosis not present

## 2023-03-02 DIAGNOSIS — R0683 Snoring: Secondary | ICD-10-CM | POA: Diagnosis not present

## 2023-03-02 DIAGNOSIS — Z9884 Bariatric surgery status: Secondary | ICD-10-CM | POA: Diagnosis not present

## 2023-03-02 DIAGNOSIS — M1712 Unilateral primary osteoarthritis, left knee: Secondary | ICD-10-CM | POA: Diagnosis not present

## 2023-03-04 DIAGNOSIS — F411 Generalized anxiety disorder: Secondary | ICD-10-CM | POA: Diagnosis not present

## 2023-03-06 DIAGNOSIS — F331 Major depressive disorder, recurrent, moderate: Secondary | ICD-10-CM | POA: Diagnosis not present

## 2023-03-11 DIAGNOSIS — F331 Major depressive disorder, recurrent, moderate: Secondary | ICD-10-CM | POA: Diagnosis not present

## 2023-03-13 DIAGNOSIS — F411 Generalized anxiety disorder: Secondary | ICD-10-CM | POA: Diagnosis not present

## 2023-03-13 IMAGING — US US MFM FETAL BPP W/ NON-STRESS
2 series · 12 of 28 positions shown · non-contrast
Comparison: none

[Series 1: us mfm fetal bpp w/ non-stress · 40 acquisitions, 9 frames shown (1 of 2)]
[im 3/40]
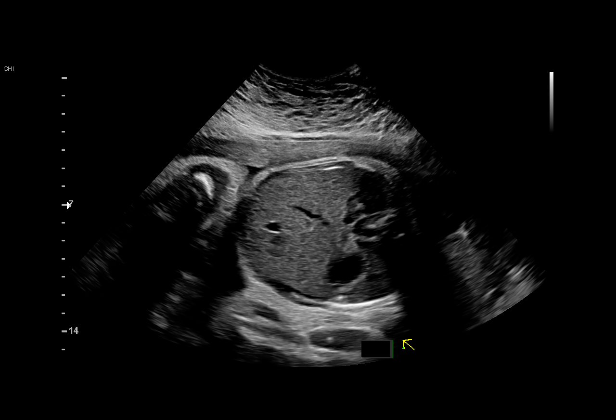
[im 7/40]
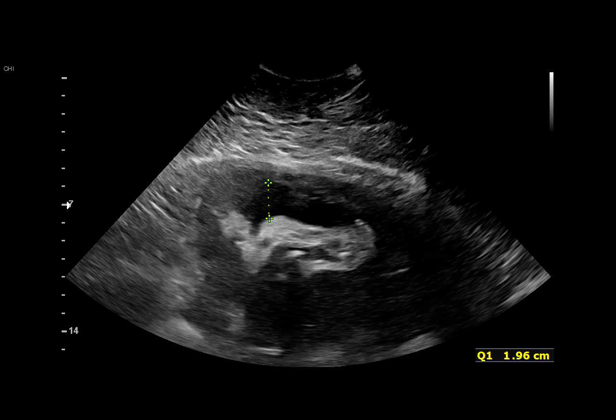
[im 11/40]
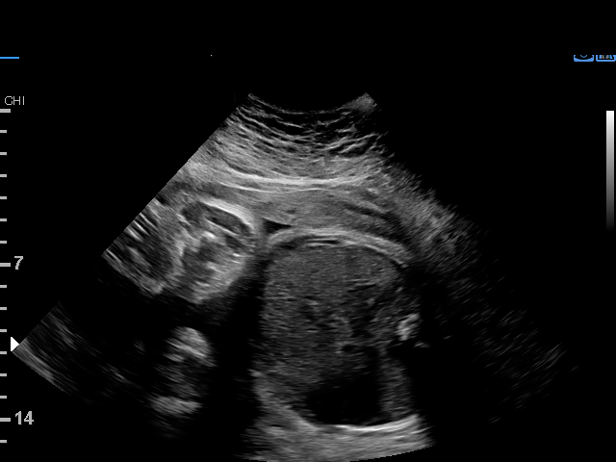
[im 17/40]
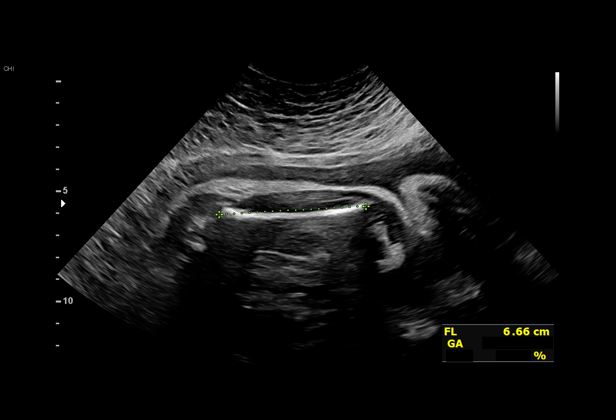
[im 21/40]
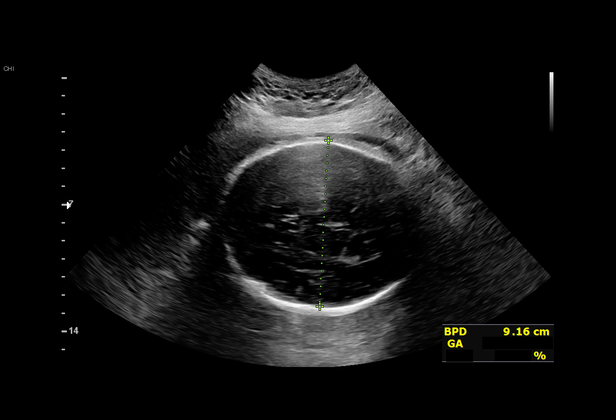
[im 25/40]
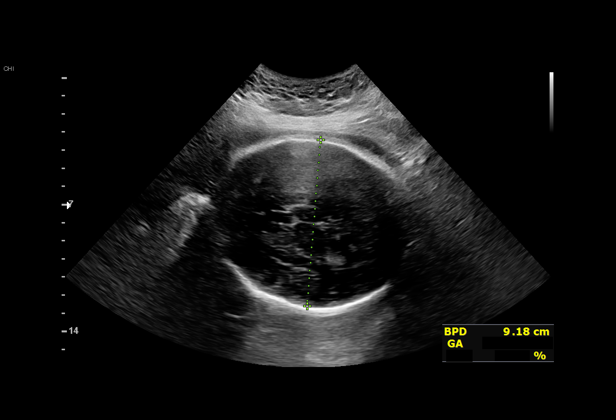
[im 31/40]
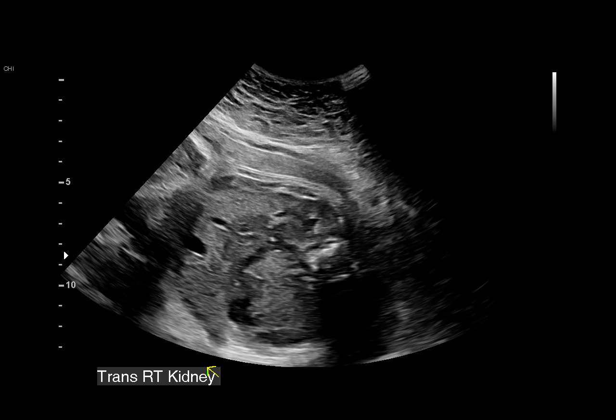
[im 35/40]
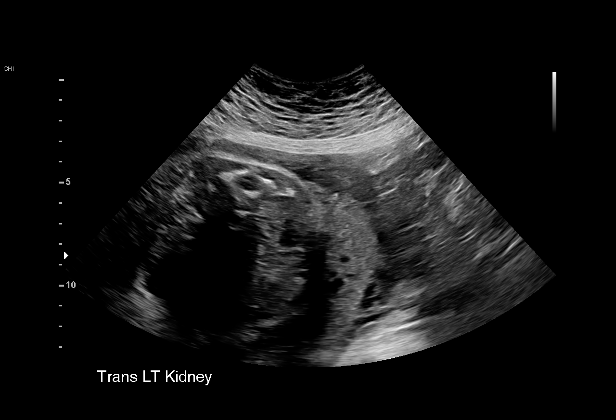
[im 40/40]
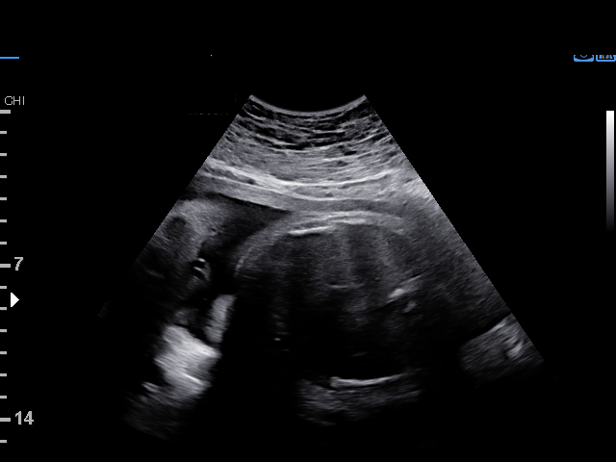

[Series 3: us mfm fetal bpp w/ non-stress · 3 of 17 slices shown (2 of 2)]
[im 5/17]
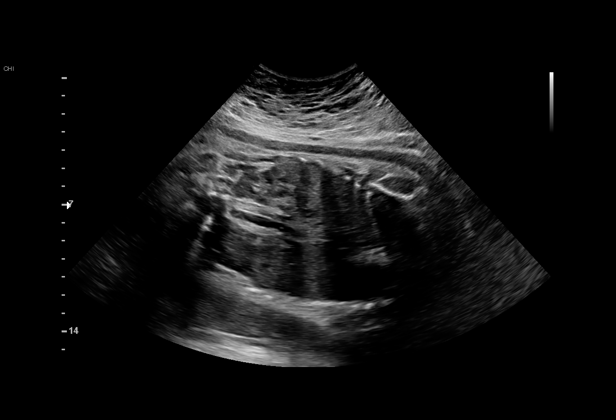
[im 10/17]
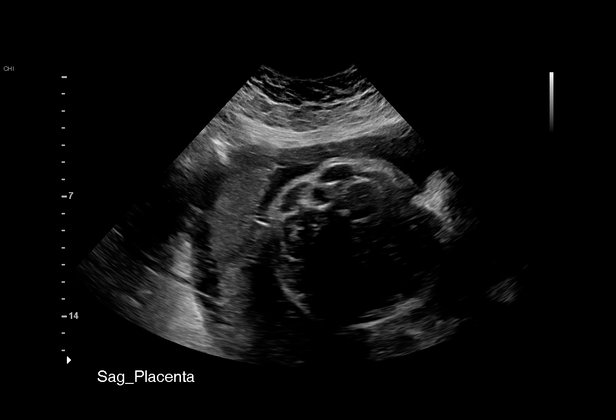
[im 14/17]
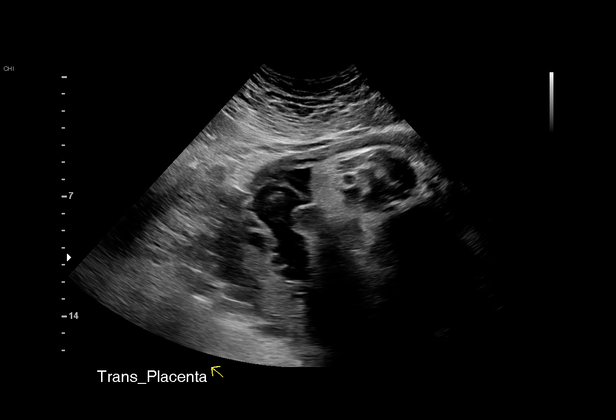

[12 of 28 positions shown; findings below may reference images not displayed]

Addendum:\.br----------------------------------------------------------------------
----------------------------------------------------------------------

----------------------------------------------------------------------

----------------------------------------------------------------------

    W/NONSTRESS
----------------------------------------------------------------------

----------------------------------------------------------------------
Indications

 Obesity complicating pregnancy, third
 trimester (BMI 61)
 Advanced maternal age multigravida 35+,
 third trimester
 History of cesarean delivery (x2), currently
 pregnant
 Pregnancy complicated by previous gastric
 bypass, antepartum, third trimester
 History of pre-term deliveries (36 wk c/s due
 to breech fetal position)
 35 weeks gestation of pregnancy
 Encounter for other antenatal screening
 follow-up
----------------------------------------------------------------------
Fetal Evaluation

 Num Of Fetuses:          1
 Fetal Heart              143
 Rate(bpm):
 Cardiac Activity:        Observed
 Presentation:            Cephalic
 Placenta:                Posterior
 P. Cord Insertion:       Previously Visualized
 Amniotic Fluid
 AFI FV:      Within normal limits

 AFI Sum(cm)     %Tile       Largest Pocket(cm)
 10.7            27

 RUQ(cm)       RLQ(cm)       LUQ(cm)        LLQ(cm)

----------------------------------------------------------------------
Biophysical Evaluation

 Amniotic F.V:   Pocket => 2 cm             F. Tone:         Observed
 F. Movement:    Observed                   N.S.T:           Reactive
 F. Breathing:   Not Observed               Score:           [DATE]
----------------------------------------------------------------------
Biometry

 BPD:      91.7  mm     G. Age:  37w 2d         90  %    CI:         75.8   %    70 - 86
                                                         FL/HC:      19.9   %    20.1 -
 HC:     333.9   mm     G. Age:  38w 1d         78  %    HC/AC:      1.05        0.93 -
 AC:     319.4   mm     G. Age:  35w 6d         64  %    FL/BPD:     72.4   %    71 - 87
 FL:       66.4  mm     G. Age:  34w 1d         11  %    FL/AC:      20.8   %    20 - 24

 LV:        6.8  mm

 Est. FW:   5222    g      6 lb 2 oz     52  %
                    m
----------------------------------------------------------------------
OB History

 Gravidity:    5         Term:   1        Prem:   1         SAB:  1
 Ectopic:      1        Living:  2
----------------------------------------------------------------------
Gestational Age

 LMP:           35w 3d        Date:  10/11/19                 EDD:   07/17/20
 U/S Today:     36w 3d                                        EDD:   07/10/20
 Best:          35w 5d     Det. By:  Early Ultrasound          EDD:  07/15/20
                                     (12/30/19)
----------------------------------------------------------------------
Anatomy

 Cranium:               Appears normal         Aortic Arch:            Previously seen
 Cavum:                 Appears normal         Ductal Arch:            Previously seen
 Ventricles:            Appears normal         Diaphragm:              Appears normal
 Choroid Plexus:        Previously seen        Stomach:                Appears normal,
                                                                       left sided
 Cerebellum:            Previously seen        Abdomen:                Appears normal
 Posterior Fossa:       Previously seen        Abdominal Wall:         Previously seen
 Nuchal Fold:           Previously seen        Cord Vessels:           Previously seen
 Face:                  Orbits and profile     Kidneys:                Appear normal
                        previously seen
 Lips:                  Previously seen        Bladder:                Appears normal
 Palate:                Not well visualized    Spine:                  Limited views
                                                                       appear normal prev.
 Heart:                 Not well visualized    Upper Extremities:      Previously seen
 RVOT:                  Previously seen        Lower Extremities:      Previously seen
 LVOT:                  Previously seen
 Other:  Male gender previously seen. 5th digit previously visualized. SVC IVC
         previously seen. Nasal bone previously visualized. Technically
         difficult due to maternal habitus.
----------------------------------------------------------------------
Cervix Uterus Adnexa

 Cervix
 Not visualized (advanced GA >68wks)
----------------------------------------------------------------------
Comments

 This patient was seen for a follow up growth scan and
 biophysical profile due to maternal obesity with a BMI of
 61.5.  She denies any problems since her last exam.
 The fetal growth and amniotic fluid level appears appropriate
 for her gestational age.
 A biophysical profile performed today was [DATE].  She
 received a -2 for fetal breathing movements that did not meet
 criteria.  She subsequently had a reactive nonstress test
 making her overall biophysical profile score [DATE].
 Due to maternal obesity and chronic hypertension, she
 should continue weekly fetal testing until delivery.

----------------------------------------------------------------------
----------------------------------------------------------------------

*** End of Addendum ***\.br----------------------------------------------------------------------
----------------------------------------------------------------------

----------------------------------------------------------------------

----------------------------------------------------------------------

    W/NONSTRESS
    W/NONSTRESS
----------------------------------------------------------------------

----------------------------------------------------------------------
Indications

 Obesity complicating pregnancy, second
 trimester (pregravid BMI 61)
 Advanced maternal age multigravida 35+,
 second trimester
 History of cesarean delivery (x2), currently
 pregnant
 Pregnancy complicated by previous gastric
 bypass, antepartum, second trimester (h/o
 gastric sleeve)
 History of pre-term deliveries (36 wk c/s due
 to breech fetal position)
 35 weeks gestation of pregnancy
----------------------------------------------------------------------
Fetal Evaluation

 Num Of Fetuses:          1
 Fetal Heart              143
 Rate(bpm):
 Cardiac Activity:        Observed
 Presentation:            Cephalic
 Placenta:                Posterior
 P. Cord Insertion:       Previously Visualized

 Amniotic Fluid
 AFI FV:      Within normal limits

 AFI Sum(cm)     %Tile       Largest Pocket(cm)
 10.7            27

 RUQ(cm)       RLQ(cm)       LUQ(cm)        LLQ(cm)

----------------------------------------------------------------------
Biophysical Evaluation

 Amniotic F.V:   Pocket => 2 cm             F. Tone:         Observed
 F. Movement:    Observed                   N.S.T:           Reactive
 F. Breathing:   Not Observed               Score:           [DATE]
----------------------------------------------------------------------
Biometry

 BPD:      91.7  mm     G. Age:  37w 2d         90  %    CI:         75.8   %    70 - 86
                                                         FL/HC:      19.9   %    20.1 -
 HC:     333.9   mm     G. Age:  38w 1d         78  %    HC/AC:      1.05        0.93 -
 AC:     319.4   mm     G. Age:  35w 6d         64  %    FL/BPD:     72.4   %    71 - 87
 FL:       66.4  mm     G. Age:  34w 1d         11  %    FL/AC:      20.8   %    20 - 24

 LV:        6.8  mm

 Est. FW:   5222    g      6 lb 2 oz     52  %
                    m
----------------------------------------------------------------------
OB History

 Gravidity:    5         Term:   1        Prem:   1         SAB:  1
 Ectopic:      1        Living:  2
----------------------------------------------------------------------
Gestational Age

 LMP:           35w 3d        Date:  10/11/19                 EDD:   07/17/20
 U/S Today:     36w 3d                                        EDD:   07/10/20
 Best:          35w 5d     Det. By:  Early Ultrasound          EDD:  07/15/20
                                     (12/30/19)
----------------------------------------------------------------------
Anatomy

 Cranium:               Appears normal         Aortic Arch:            Previously seen
 Cavum:                 Appears normal         Ductal Arch:            Previously seen
 Ventricles:            Appears normal         Diaphragm:              Appears normal
 Choroid Plexus:        Previously seen        Stomach:                Appears normal,
                                                                       left sided
 Cerebellum:            Previously seen        Abdomen:                Appears normal
 Posterior Fossa:       Previously seen        Abdominal Wall:         Previously seen
 Nuchal Fold:           Previously seen        Cord Vessels:           Previously seen
 Face:                  Orbits and profile     Kidneys:                Appear normal
                        previously seen
 Lips:                  Previously seen        Bladder:                Appears normal
 Palate:                Not well visualized    Spine:                  Limited views
                                                                       appear normal prev.
 Heart:                 Not well visualized    Upper Extremities:      Previously seen
 RVOT:                  Previously seen        Lower Extremities:      Previously seen
 LVOT:                  Previously seen

 Other:  Male gender previously seen. 5th digit previously visualized. SVC IVC
         previously seen. Nasal bone previously visualized. Technically
         difficult due to maternal habitus.
----------------------------------------------------------------------
Cervix Uterus Adnexa

 Cervix
 Not visualized (advanced GA >68wks)
----------------------------------------------------------------------
Comments

 This patient was seen for a follow up growth scan and
 biophysical profile due to maternal obesity with a BMI of
 61.5.  She denies any problems since her last exam.
 The fetal growth and amniotic fluid level appears appropriate
 for her gestational age.
 A biophysical profile performed today was [DATE].  She
 received a -2 for fetal breathing movements that did not meet
 criteria.  She subsequently had a reactive nonstress test
 making her overall biophysical profile score [DATE].
 Due to maternal obesity and chronic hypertension, she
 should continue weekly fetal testing until delivery.

----------------------------------------------------------------------
----------------------------------------------------------------------

## 2023-03-14 DIAGNOSIS — Z6841 Body Mass Index (BMI) 40.0 and over, adult: Secondary | ICD-10-CM | POA: Diagnosis not present

## 2023-03-14 DIAGNOSIS — Z7189 Other specified counseling: Secondary | ICD-10-CM | POA: Diagnosis not present

## 2023-03-14 DIAGNOSIS — F54 Psychological and behavioral factors associated with disorders or diseases classified elsewhere: Secondary | ICD-10-CM | POA: Diagnosis not present

## 2023-03-17 DIAGNOSIS — Z419 Encounter for procedure for purposes other than remedying health state, unspecified: Secondary | ICD-10-CM | POA: Diagnosis not present

## 2023-03-18 DIAGNOSIS — F411 Generalized anxiety disorder: Secondary | ICD-10-CM | POA: Diagnosis not present

## 2023-03-19 DIAGNOSIS — F331 Major depressive disorder, recurrent, moderate: Secondary | ICD-10-CM | POA: Diagnosis not present

## 2023-03-21 DIAGNOSIS — M47816 Spondylosis without myelopathy or radiculopathy, lumbar region: Secondary | ICD-10-CM | POA: Diagnosis not present

## 2023-03-21 DIAGNOSIS — M1712 Unilateral primary osteoarthritis, left knee: Secondary | ICD-10-CM | POA: Diagnosis not present

## 2023-03-22 DIAGNOSIS — F411 Generalized anxiety disorder: Secondary | ICD-10-CM | POA: Diagnosis not present

## 2023-03-23 DIAGNOSIS — M47816 Spondylosis without myelopathy or radiculopathy, lumbar region: Secondary | ICD-10-CM | POA: Diagnosis not present

## 2023-03-23 DIAGNOSIS — E669 Obesity, unspecified: Secondary | ICD-10-CM | POA: Diagnosis not present

## 2023-03-23 DIAGNOSIS — E119 Type 2 diabetes mellitus without complications: Secondary | ICD-10-CM | POA: Diagnosis not present

## 2023-03-26 DIAGNOSIS — F411 Generalized anxiety disorder: Secondary | ICD-10-CM | POA: Diagnosis not present

## 2023-03-27 DIAGNOSIS — F411 Generalized anxiety disorder: Secondary | ICD-10-CM | POA: Diagnosis not present

## 2023-04-02 DIAGNOSIS — Z713 Dietary counseling and surveillance: Secondary | ICD-10-CM | POA: Diagnosis not present

## 2023-04-02 DIAGNOSIS — F411 Generalized anxiety disorder: Secondary | ICD-10-CM | POA: Diagnosis not present

## 2023-04-05 ENCOUNTER — Encounter: Payer: Self-pay | Admitting: Student

## 2023-04-05 ENCOUNTER — Ambulatory Visit (INDEPENDENT_AMBULATORY_CARE_PROVIDER_SITE_OTHER): Admitting: Student

## 2023-04-05 DIAGNOSIS — Z903 Acquired absence of stomach [part of]: Secondary | ICD-10-CM | POA: Diagnosis not present

## 2023-04-05 DIAGNOSIS — F411 Generalized anxiety disorder: Secondary | ICD-10-CM | POA: Diagnosis not present

## 2023-04-05 DIAGNOSIS — Z6841 Body Mass Index (BMI) 40.0 and over, adult: Secondary | ICD-10-CM

## 2023-04-05 DIAGNOSIS — Z87891 Personal history of nicotine dependence: Secondary | ICD-10-CM | POA: Diagnosis not present

## 2023-04-05 MED ORDER — METFORMIN HCL ER 500 MG PO TB24: 1000.0000 mg | ORAL_TABLET | Freq: Two times a day (BID) | ORAL | 3 refills | Status: DC

## 2023-04-05 NOTE — Patient Instructions (Addendum)
 It was great to see you today! Thank you for choosing [facility] for your primary care.  Today we addressed: I will complete the forms as requested.  Please return for the surgical clearance form if necessary.  If you haven't already, sign up for My Chart to have easy access to your labs results, and communication with your primary care physician.  Return if symptoms worsen or fail to improve. Please arrive 15 minutes before your appointment to ensure smooth check in process.  We appreciate your efforts in making this happen.  Thank you for allowing me to participate in your care, Shelby Mattocks, DO 04/05/2023, 10:37 AM PGY-3, [facility]

## 2023-04-05 NOTE — Assessment & Plan Note (Signed)
 Surgery is anticipated to alleviate pain and improve quality of life, with an understanding that recovery may require several weeks of continuous leave. - Schedule an appointment for surgical clearance if necessary on form provided. - Continue current diet and weight loss efforts to reduce BMI to the required level for surgery. - Plan for continuous leave from April 4th to July 31st to accommodate surgery and recovery - she is to complete this form - Complete paperwork for work schedule accommodation to allow a stable schedule from 8:00 AM to 4:30 PM. - Include provisions for absence during medical flare-ups and treatments.

## 2023-04-05 NOTE — Progress Notes (Signed)
  SUBJECTIVE:   CHIEF COMPLAINT / HPI:   Danielle [name] is a 38 year old female who presents for employee paperwork related to a medical shift change and bariatric surgery clearance.  She experiences physical discomfort, particularly lower back pain, when sitting for extended periods. She reports feeling better during the daytime, and her current inconsistent schedule exacerbates discomfort. Her current work schedule includes variable shifts, and she feels better during the daytime. She requests a stable schedule from 8:00 AM to 4:30 PM to accommodate her medical needs and appointments. The accommodation is necessary to prevent being placed in a shift bid every three months, which does not allow for stable scheduling.  She is in the process of obtaining bariatric surgery and requires further documentation although does not present with these forms today. She is working on reducing her BMI to between 50 and 55 as part of the pre-surgery requirements. Her next appointment with the bariatric surgeon is scheduled for August, with a possibility of being moved up.  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 - Schedule an appointment for surgical clearance if necessary on form provided. - Continue current diet and weight loss efforts to reduce BMI to the required level for surgery. - Plan for continuous leave from April 4th to July 31st to accommodate surgery and recovery - she is to complete this form - Complete paperwork for work schedule accommodation to allow a stable  schedule from 8:00 AM to 4:30 PM. - Include provisions for absence during medical flare-ups and treatments.  Shelby Mattocks, DO 04/05/2023, 2:13 PM PGY-3, [facility]

## 2023-04-06 ENCOUNTER — Encounter: Payer: Self-pay | Admitting: Student

## 2023-04-06 DIAGNOSIS — M1712 Unilateral primary osteoarthritis, left knee: Secondary | ICD-10-CM | POA: Diagnosis not present

## 2023-04-08 DIAGNOSIS — F332 Major depressive disorder, recurrent severe without psychotic features: Secondary | ICD-10-CM | POA: Diagnosis not present

## 2023-04-12 DIAGNOSIS — F411 Generalized anxiety disorder: Secondary | ICD-10-CM | POA: Diagnosis not present

## 2023-04-13 ENCOUNTER — Encounter: Payer: Self-pay | Admitting: Student

## 2023-04-15 DIAGNOSIS — F332 Major depressive disorder, recurrent severe without psychotic features: Secondary | ICD-10-CM | POA: Diagnosis not present

## 2023-04-18 DIAGNOSIS — F332 Major depressive disorder, recurrent severe without psychotic features: Secondary | ICD-10-CM | POA: Diagnosis not present

## 2023-04-20 DIAGNOSIS — M47816 Spondylosis without myelopathy or radiculopathy, lumbar region: Secondary | ICD-10-CM | POA: Diagnosis not present

## 2023-04-20 DIAGNOSIS — E119 Type 2 diabetes mellitus without complications: Secondary | ICD-10-CM | POA: Diagnosis not present

## 2023-04-20 DIAGNOSIS — E669 Obesity, unspecified: Secondary | ICD-10-CM | POA: Diagnosis not present

## 2023-04-22 NOTE — Progress Notes (Unsigned)
 Surgical Pre-operative Evaluation:  Procedure: *** Procedural risk: ***   low risk- ambulatory surgery outpatient, cataract, endoscopy, skin surgery, breast surgery High risk- peripheral vascular surgery, abdominal aorta surgery EVERYTHING ELSE IS INTERMEDIATE RISK (orthopedic surgery, head and neck procedures, hysterectomy/gyn surgeries, hernia repair, cholescystectomy)  Anesthesia: *** (general, spinal, regional block)  PMH: ***  Surgical History:  The patient denies*** any complication with anesthesia, bleeding, or post-operative confusion, nausea, or vomiting in prior surgical interventions. The patient denies any *** history of spinal surgeries.  Family History: The patient denies*** any family history of complications with anesthesia and VTE.  Social History: Tobacco Use: *** (Recommend smoking cessation anytime before surgery to improve wound healing, 4-8 weeks befter surgery reduces risk of respiratory complications) Alcohol Use: *** Other substance use: ***  Screening for OSA: STOP BANG score of ***, *** risk of OSA  Medication Adjustments: ***can delete and put your patient's specific medications*** - Generally hold ACEI/ARB, and diuretics on day of surgery - Generally continute steroids, amlodipine, BB, clonidine (risk of withdrawal associated with harm) - Continue statins, levothyroxine - SGLT2 inhibitors stopped 3-4 days prior to surgery, other oral and injectable non-insulin agents held on day of surgery - Insulin- reduce basal dose by 10-25% prior to surgery (either night before or morning of depending on how patient takes it) and hold prandial insulin on day of surgery - for DAPT- talk to cards! Earliest to possibly hold is 4-6 weeks after placement of BMS/DES - Antiplatelet- Plavix for a minimum of 5 days and continue aspirin if possible, prasugrel hold for 7 days, ticagrelor hold for 3 days - Aspirin - if for primary prevention hold, if for secondary prevention  continue if possible (if spinal surgery or high risk brain surgery hold for 5 days) - Anticoagulants:  - Consider bridging for patients on warfarin for mechanical mitral valve, mechanical aortic valve, high CHADS2VASC score- stop 5 days before surgery and proceed with surgery when INR < 1.5         - Apixaban- stop 24-36hrs before moderate risk surgery, 48+hrs before high risk surgery (depends on CrCl)  - Rivaroxaban - same as apixaban - Caribopda-levodopa- contiue through day of srugery as possible, attempt to scehdule first case of day (increase risk of confusion/rigidity when held) - SSRI/SNRI- consider holding if high risk procedure or aspirin therapy  Risk Stratification Tool: Chales Abrahams Calculator: *** % risk of MI or cardiac event, intraoperatively or up to 30 days post-op  Functional Capacity:  Walking 2 blocks = 2.75 METS  Climb 1 flight of stairs = 5.50 METS   Pushing the mower = 4.50 METS   Singles tennis = 8 METS   If patients can achieve > 4 METS- no stress test recommended (if they cannot, consider a stress test as this can change management)  Labs to Check: - CBC- if > 61 yo and undergoing intermediate or high risk surgery, or younger patient that may have significant blood loss - Renal function- if taking antihypertensives or meds that can affect renal function or diseases like T2DM that can affect renal function - A1c- reasonable to check for ages 43-70 who are overweight/obese, or known T2DM/prediabetes - Coagulation testing- if history of easy bleeding, known liver or renal disease, taking anticoagulants - BNP - if age  > 57yo, or age 49-64 with significant CV disease - Urinalysis- if undergoing implantation of prosthetic joint or valve, undergoing invasive urologic procedures  ECG: reasonable if known CV disease or other risk factors undergoing intermediate  or high risk surgery CXR: only for patients with new or unstable cardiopulmonary symptoms  Final  Assessment:  The patient is at low***intermediate***high*** risk of complications from a low***moderate***high*** risk surgery.  I recommend the following additional tests: I recommend the following changes to medications in the perioperative setting:

## 2023-04-23 ENCOUNTER — Ambulatory Visit (INDEPENDENT_AMBULATORY_CARE_PROVIDER_SITE_OTHER): Admitting: Student

## 2023-04-23 VITALS — BP 127/74 | HR 99 | Ht 64.0 in | Wt >= 6400 oz

## 2023-04-23 DIAGNOSIS — Z01818 Encounter for other preprocedural examination: Secondary | ICD-10-CM

## 2023-04-23 NOTE — Patient Instructions (Addendum)
 It was great to see you today! Thank you for choosing [facility] for your primary care.  Today we addressed: I will complete the paperwork regarding the accommodations form.  Additionally I will complete the bariatric surgery support letter when able and have that form faxed to the appropriate number.  Please keep in touch with me regarding any changes you need made and when your bariatric surgery is scheduled and the results of your sleep apnea studies.  If you haven't already, sign up for My Chart to have easy access to your labs results, and communication with your primary care physician. We are checking some labs today. If they are abnormal, I will call you. If they are normal, I will send you a MyChart message (if it is active) or a letter in the mail. If you do not hear about your labs in the next 2 weeks, please call the office. Return if symptoms worsen or fail to improve. Please arrive 15 minutes before your appointment to ensure smooth check in process.  We appreciate your efforts in making this happen.  Thank you for allowing me to participate in your care, Shelby Mattocks, DO 04/23/2023, 12:27 PM PGY-3, [facility]

## 2023-04-24 ENCOUNTER — Encounter: Payer: Self-pay | Admitting: Student

## 2023-04-25 DIAGNOSIS — F411 Generalized anxiety disorder: Secondary | ICD-10-CM | POA: Diagnosis not present

## 2023-04-27 DIAGNOSIS — F411 Generalized anxiety disorder: Secondary | ICD-10-CM | POA: Diagnosis not present

## 2023-04-28 DIAGNOSIS — Z419 Encounter for procedure for purposes other than remedying health state, unspecified: Secondary | ICD-10-CM | POA: Diagnosis not present

## 2023-04-29 DIAGNOSIS — F332 Major depressive disorder, recurrent severe without psychotic features: Secondary | ICD-10-CM | POA: Diagnosis not present

## 2023-04-30 ENCOUNTER — Other Ambulatory Visit

## 2023-04-30 DIAGNOSIS — Z01818 Encounter for other preprocedural examination: Secondary | ICD-10-CM

## 2023-05-01 ENCOUNTER — Encounter: Payer: Self-pay | Admitting: Student

## 2023-05-01 ENCOUNTER — Other Ambulatory Visit

## 2023-05-01 DIAGNOSIS — Z713 Dietary counseling and surveillance: Secondary | ICD-10-CM | POA: Diagnosis not present

## 2023-05-01 DIAGNOSIS — Z6841 Body Mass Index (BMI) 40.0 and over, adult: Secondary | ICD-10-CM | POA: Diagnosis not present

## 2023-05-01 LAB — TSH: TSH: 1.25 u[IU]/mL (ref 0.450–4.500)

## 2023-05-01 LAB — COMPREHENSIVE METABOLIC PANEL WITH GFR
ALT: 16 IU/L (ref 0–32)
AST: 17 IU/L (ref 0–40)
Albumin: 4.1 g/dL (ref 3.9–4.9)
Alkaline Phosphatase: 62 IU/L (ref 44–121)
BUN/Creatinine Ratio: 17 (ref 9–23)
BUN: 12 mg/dL (ref 6–20)
Bilirubin Total: 0.3 mg/dL (ref 0.0–1.2)
CO2: 22 mmol/L (ref 20–29)
Calcium: 9.2 mg/dL (ref 8.7–10.2)
Chloride: 107 mmol/L — ABNORMAL HIGH (ref 96–106)
Creatinine, Ser: 0.72 mg/dL (ref 0.57–1.00)
Globulin, Total: 2.6 g/dL (ref 1.5–4.5)
Glucose: 94 mg/dL (ref 70–99)
Potassium: 3.9 mmol/L (ref 3.5–5.2)
Sodium: 142 mmol/L (ref 134–144)
Total Protein: 6.7 g/dL (ref 6.0–8.5)
eGFR: 110 mL/min/{1.73_m2} (ref >59)

## 2023-05-01 LAB — CBC
Hematocrit: 34.6 % (ref 34.0–46.6)
Hemoglobin: 10.1 g/dL — ABNORMAL LOW (ref 11.1–15.9)
MCH: 23.4 pg — ABNORMAL LOW (ref 26.6–33.0)
MCHC: 29.2 g/dL — ABNORMAL LOW (ref 31.5–35.7)
MCV: 80 fL (ref 79–97)
Platelets: 320 10*3/uL (ref 150–450)
RBC: 4.31 x10E6/uL (ref 3.77–5.28)
RDW: 12.9 % (ref 11.7–15.4)
WBC: 9.6 10*3/uL (ref 3.4–10.8)

## 2023-05-03 DIAGNOSIS — F332 Major depressive disorder, recurrent severe without psychotic features: Secondary | ICD-10-CM | POA: Diagnosis not present

## 2023-05-04 ENCOUNTER — Telehealth: Payer: Self-pay

## 2023-05-04 DIAGNOSIS — G8929 Other chronic pain: Secondary | ICD-10-CM

## 2023-05-06 DIAGNOSIS — F411 Generalized anxiety disorder: Secondary | ICD-10-CM | POA: Diagnosis not present

## 2023-05-09 ENCOUNTER — Telehealth: Payer: Self-pay | Admitting: *Deleted

## 2023-05-09 DIAGNOSIS — G4719 Other hypersomnia: Secondary | ICD-10-CM | POA: Diagnosis not present

## 2023-05-09 DIAGNOSIS — Z6841 Body Mass Index (BMI) 40.0 and over, adult: Secondary | ICD-10-CM | POA: Diagnosis not present

## 2023-05-09 NOTE — Progress Notes (Signed)
 Complex Care Management Note Care Guide Note  05/09/2023 Name: Danielle [name] MRN: [id number] DOB: 09/16/1985   Complex Care Management Outreach Attempts: An unsuccessful telephone outreach was attempted today to offer the patient information about available complex care management services.  Follow Up Plan:  Additional outreach attempts will be made to offer the patient complex care management information and services.   Encounter Outcome:  No Answer  Barnie Bora  [facility]  [facility], [facility] Guide  Direct Dial : [phone]  Fax [phone]

## 2023-05-11 DIAGNOSIS — F411 Generalized anxiety disorder: Secondary | ICD-10-CM | POA: Diagnosis not present

## 2023-05-13 DIAGNOSIS — F411 Generalized anxiety disorder: Secondary | ICD-10-CM | POA: Diagnosis not present

## 2023-05-15 DIAGNOSIS — F411 Generalized anxiety disorder: Secondary | ICD-10-CM | POA: Diagnosis not present

## 2023-05-15 NOTE — Progress Notes (Unsigned)
 Complex Care Management Note Care Guide Note  05/15/2023 Name: Danielle [name] MRN: [id number] DOB: 1985/04/09   Complex Care Management Outreach Attempts: A second unsuccessful outreach was attempted today to offer the patient with information about available complex care management services.  Follow Up Plan:  Additional outreach attempts will be made to offer the patient complex care management information and services.   Encounter Outcome:  No Answer  Barnie Bora  [facility]  [facility], [facility] Guide  Direct Dial : [phone]  Fax [phone]

## 2023-05-16 NOTE — Progress Notes (Signed)
 Complex Care Management Note  Care Guide Note 05/16/2023 Name: Danielle [name] MRN: [id number] DOB: 05/21/1985  Danielle [name] is a 38 y.o. year old female who sees Veronia Goon, [place] for primary care. I reached out to Yohanna Godbey by phone today to offer complex care management services.  [name] was given information about Complex Care Management services today including:   The Complex Care Management services include support from the care team which includes your Nurse Care Manager, Clinical Social Worker, or Pharmacist.  The Complex Care Management team is here to help remove barriers to the health concerns and goals most important to you. Complex Care Management services are voluntary, and the patient may decline or stop services at any time by request to their care team member.   Complex Care Management Consent Status: Patient agreed to services and verbal consent obtained.   Follow up plan:  Telephone appointment with complex care management team member scheduled for:  05/24/23  Encounter Outcome:  Patient Scheduled  Barnie Bora  [facility]  [facility], [facility] Guide  Direct Dial : [phone]  Fax [phone]

## 2023-05-18 DIAGNOSIS — Z01818 Encounter for other preprocedural examination: Secondary | ICD-10-CM | POA: Diagnosis not present

## 2023-05-18 DIAGNOSIS — Z87891 Personal history of nicotine dependence: Secondary | ICD-10-CM | POA: Diagnosis not present

## 2023-05-20 DIAGNOSIS — F332 Major depressive disorder, recurrent severe without psychotic features: Secondary | ICD-10-CM | POA: Diagnosis not present

## 2023-05-22 DIAGNOSIS — G4719 Other hypersomnia: Secondary | ICD-10-CM | POA: Diagnosis not present

## 2023-05-24 ENCOUNTER — Telehealth: Payer: Self-pay

## 2023-05-25 ENCOUNTER — Encounter: Payer: Self-pay | Admitting: Student

## 2023-05-25 DIAGNOSIS — F332 Major depressive disorder, recurrent severe without psychotic features: Secondary | ICD-10-CM | POA: Diagnosis not present

## 2023-05-28 DIAGNOSIS — F332 Major depressive disorder, recurrent severe without psychotic features: Secondary | ICD-10-CM | POA: Diagnosis not present

## 2023-05-28 DIAGNOSIS — Z419 Encounter for procedure for purposes other than remedying health state, unspecified: Secondary | ICD-10-CM | POA: Diagnosis not present

## 2023-05-30 DIAGNOSIS — F332 Major depressive disorder, recurrent severe without psychotic features: Secondary | ICD-10-CM | POA: Diagnosis not present

## 2023-05-31 DIAGNOSIS — Z6841 Body Mass Index (BMI) 40.0 and over, adult: Secondary | ICD-10-CM | POA: Diagnosis not present

## 2023-06-03 DIAGNOSIS — F332 Major depressive disorder, recurrent severe without psychotic features: Secondary | ICD-10-CM | POA: Diagnosis not present

## 2023-06-06 DIAGNOSIS — F332 Major depressive disorder, recurrent severe without psychotic features: Secondary | ICD-10-CM | POA: Diagnosis not present

## 2023-06-08 DIAGNOSIS — Z6841 Body Mass Index (BMI) 40.0 and over, adult: Secondary | ICD-10-CM | POA: Diagnosis not present

## 2023-06-08 DIAGNOSIS — G4710 Hypersomnia, unspecified: Secondary | ICD-10-CM | POA: Diagnosis not present

## 2023-06-10 DIAGNOSIS — F332 Major depressive disorder, recurrent severe without psychotic features: Secondary | ICD-10-CM | POA: Diagnosis not present

## 2023-06-12 DIAGNOSIS — F332 Major depressive disorder, recurrent severe without psychotic features: Secondary | ICD-10-CM | POA: Diagnosis not present

## 2023-06-14 DIAGNOSIS — F411 Generalized anxiety disorder: Secondary | ICD-10-CM | POA: Diagnosis not present

## 2023-06-17 DIAGNOSIS — F411 Generalized anxiety disorder: Secondary | ICD-10-CM | POA: Diagnosis not present

## 2023-06-22 ENCOUNTER — Telehealth: Payer: Self-pay

## 2023-06-22 DIAGNOSIS — E119 Type 2 diabetes mellitus without complications: Secondary | ICD-10-CM | POA: Diagnosis not present

## 2023-06-22 DIAGNOSIS — G588 Other specified mononeuropathies: Secondary | ICD-10-CM | POA: Diagnosis not present

## 2023-06-22 DIAGNOSIS — M1712 Unilateral primary osteoarthritis, left knee: Secondary | ICD-10-CM | POA: Diagnosis not present

## 2023-06-22 DIAGNOSIS — E669 Obesity, unspecified: Secondary | ICD-10-CM | POA: Diagnosis not present

## 2023-06-22 DIAGNOSIS — F411 Generalized anxiety disorder: Secondary | ICD-10-CM | POA: Diagnosis not present

## 2023-06-24 DIAGNOSIS — F411 Generalized anxiety disorder: Secondary | ICD-10-CM | POA: Diagnosis not present

## 2023-06-26 ENCOUNTER — Other Ambulatory Visit (HOSPITAL_COMMUNITY): Payer: Self-pay

## 2023-06-26 ENCOUNTER — Encounter: Payer: Self-pay | Admitting: *Deleted

## 2023-06-27 ENCOUNTER — Other Ambulatory Visit: Payer: Self-pay

## 2023-06-28 DIAGNOSIS — F411 Generalized anxiety disorder: Secondary | ICD-10-CM | POA: Diagnosis not present

## 2023-06-28 DIAGNOSIS — Z419 Encounter for procedure for purposes other than remedying health state, unspecified: Secondary | ICD-10-CM | POA: Diagnosis not present

## 2023-06-28 DIAGNOSIS — Z903 Acquired absence of stomach [part of]: Secondary | ICD-10-CM | POA: Diagnosis not present

## 2023-06-28 DIAGNOSIS — M5416 Radiculopathy, lumbar region: Secondary | ICD-10-CM | POA: Diagnosis not present

## 2023-06-28 DIAGNOSIS — M1712 Unilateral primary osteoarthritis, left knee: Secondary | ICD-10-CM | POA: Diagnosis not present

## 2023-06-28 DIAGNOSIS — Z6841 Body Mass Index (BMI) 40.0 and over, adult: Secondary | ICD-10-CM | POA: Diagnosis not present

## 2023-06-29 NOTE — Patient Instructions (Signed)
 Visit Information  Danielle Danielle [name] - please call [facility] at [phone] to make appointment for dental cleaning/xray.  Be persistent and call again if staff doesn't return your call within a week or two.  OR call your health insurance, [facility] (see number below) and they will help you find a dentist and vision center that takes your insurance.  You can also visit [employer]'s website (see info below):   Danielle Danielle [name] was given information about Medicaid Managed Care team care coordination services as a part of their [facility] Medicaid benefit. Danielle Danielle [name] verbally consented to engagement with Danielle Danielle [name] Managed Care team.   If you are experiencing a medical emergency, please call 911 or report to your local emergency department or urgent care.   If you have a non-emergency medical problem during routine business hours, please contact your provider's office and ask to speak with a nurse.   For questions related to your [facility] health plan, please call: [phone] or go here:[email or web address]  If you would like to schedule transportation through your [name] plan, please call Danielle following number at least 2 days in advance of your appointment: [phone].   You can also use Danielle MTM portal or MTM mobile app to manage your rides. Reimbursement for transportation is available through Danielle Danielle [name]! For Danielle portal, please go to [email or web address]  Call Danielle Danielle Danielle [name] Crisis Line at [phone], at any time, 24 hours a day, 7 days a week. If you are in danger or need immediate medical attention call 911.  If you would like help to quit smoking, call 1-800-QUIT-NOW (704 667 9618) OR Espaol: 1-855-Djelo-Ya (4-403-474-2595) o para ms informacin haga clic aqu or Text READY to 638-756 to register via text  Danielle Danielle [name] - following are Danielle goals we discussed in your visit [name]:   Goals Addressed             This Visit's Progress    VBCI RN Care Plan        Problems:  Care Coordination needs related to Dental and Vision  Goal: Over Danielle next 30 days Danielle Danielle [name] will demonstrate Improved health management independence as evidenced by making Dental and Vision appointments          Interventions:   Health Maintenance Interventions: Danielle [name] interviewed about adult health maintenance status including  Regular eye checkups Regular Dental Care     Danielle [name] Self-Care Activities:  Danielle [name] will call customer service number on back of [facility] insurance card for local dentists and eyecare centers that take her insurance.   Plan:  Telephone follow up appointment with care management team member scheduled for:  two weeks.           VBCI RN Care Plan       Problems:  Chronic Disease Management support and education needs related to Morbid Obesity  Goal: Over Danielle next 30 days Danielle Danielle [name] will verbalize understanding of plan for management of Morbid Obesity as evidenced by attending appointment with [facility] in [place] to learn of plan for gastric bypass.   Interventions:   Evaluation of current treatment plan related to obesity , Limited education about current plan for weight loss* self-management and Danielle [name]'s adherence to plan as established by provider. Discussed plans with Danielle [name] for ongoing care management follow up and provided Danielle [name] with direct contact information for care management team Encouraged attending appointment to learn more about bariatric surgery plan.   Danielle [name] Self-Care Activities:  Attend appointment on 06/28/23 at [name] in [place].  Plan:  Telephone follow up appointment with care management team member scheduled for:  two weeks.               Danielle [name] verbalizes understanding of instructions and care plan provided [name] and agrees to view in MyChart. Active MyChart status and Danielle [name] understanding of how to access instructions and care plan via MyChart confirmed with  Danielle [name].     Telephone follow up appointment with Managed Medicaid care management team member scheduled NWG:[id number], June 25th at 2:45pm.   Danielle Danielle [name] BSN, CCM [place]  VBCI Population Health RN Care Manager Direct Dial : [phone]  Fax: [phone]   Following is a copy of your plan of care:  There are no care plans that you recently modified to display for this Danielle [name].

## 2023-06-29 NOTE — Patient Outreach (Signed)
 Complex Care Management   Visit Note  06/29/2023  Name:  Danielle [name] MRN: [id number] DOB: 1985/07/29  Situation: Referral received for Complex Care Management related to Obesity and Health Maintenance for Dental and Vision I obtained verbal consent from Patient.  Visit completed with Ms. Canion  on the phone  Background:   Past Medical History:  Diagnosis Date   Degenerative arthritis of knee    left    Hx of alpha thalassemia     Assessment: Patient Reported Symptoms:  Cognitive Cognitive Status: Alert and oriented to person, place, and time, Normal speech and language skills, Insightful and able to interpret abstract concepts Cognitive/Intellectual Conditions Management [RPT]: None reported or documented in medical history or problem list      Neurological Neurological Review of Symptoms: No symptoms reported    HEENT HEENT Symptoms Reported: No symptoms reported      Cardiovascular Cardiovascular Symptoms Reported: No symptoms reported Does patient have uncontrolled Hypertension?: No    Respiratory Respiratory Symptoms Reported: No symptoms reported    Endocrine Patient reports the following symptoms related to hypoglycemia or hyperglycemia : No symptoms reported Is patient diabetic?: No    Gastrointestinal Gastrointestinal Symptoms Reported: No symptoms reported      Genitourinary Genitourinary Symptoms Reported: No symptoms reported    Integumentary Integumentary Symptoms Reported: No symptoms reported    Musculoskeletal Musculoskelatal Symptoms Reviewed: Other Other Musculoskeletal Symptoms: Had Left Knee Nerve Ablation on 06/22/23, recovering, rates pain of 6 after taking pain meds, patient is aware some pain is expected after surgery, feels it is improving as expected. Musculoskeletal Conditions: Joint pain Falls in the past year?: Yes (Was walking in the snow and fell, hurt her back.) Number of falls in past year: 2 or more    Psychosocial       Quality  of Family Relationships: helpful, involved, supportive Do you feel physically threatened by others?: No      06/27/2023    3:05 PM  Depression screen PHQ 2/9  Decreased Interest 0  Down, Depressed, Hopeless 0  PHQ - 2 Score 0    There were no vitals filed for this visit.  Medications Reviewed Today     Reviewed by Isadore Marble, RN (Registered Nurse) on 06/27/23 at 1454  Med List Status: <None>   Medication Order Taking? Sig Documenting Provider Last Dose Status Informant  coconut oil OIL [id number] No Apply 1 application topically as needed.  Patient not taking: Reported on 06/27/2023   Derick Fleeting, CNM Not Taking Active   gabapentin  (NEURONTIN ) 400 MG capsule [id number] Yes Take 400 mg by mouth 3 (three) times daily. [provider] Taking Active   HYDROcodone-acetaminophen  (NORCO/VICODIN) 5-325 MG tablet [id number] Yes Take 1 tablet by mouth 2 (two) times daily as needed. [provider] Taking Active   Iron , Ferrous Gluconate , 256 (28 Fe) MG TABS [id number] Yes Take 1 tablet by mouth every other day. Stinson, Jacob J, DO Taking Active Self  phentermine (ADIPEX-P) 37.5 MG tablet [id number]  Take 37.5 mg by mouth. [provider]  Expired 05/05/23 2359   topiramate (TOPAMAX) 25 MG tablet [id number] Yes Take 25 mg by mouth daily. [provider] Taking Active             Recommendation:   Specialty provider follow-up : patient will attend appointment with [facility] on 06/28/23 to learn of weight loss plan.  Patient will call [facility] health insurance navigator when she is ready to make dental and  vision appointments.   Follow Up Plan:   Telephone follow up appointment date/time:  two weeks.  Jurline Olmsted BSN, CCM [place]  VBCI Population Health RN Care Manager Direct Dial : [phone]  Fax: [phone]

## 2023-07-01 DIAGNOSIS — F332 Major depressive disorder, recurrent severe without psychotic features: Secondary | ICD-10-CM | POA: Diagnosis not present

## 2023-07-02 DIAGNOSIS — F332 Major depressive disorder, recurrent severe without psychotic features: Secondary | ICD-10-CM | POA: Diagnosis not present

## 2023-07-07 DIAGNOSIS — F332 Major depressive disorder, recurrent severe without psychotic features: Secondary | ICD-10-CM | POA: Diagnosis not present

## 2023-07-10 DIAGNOSIS — F332 Major depressive disorder, recurrent severe without psychotic features: Secondary | ICD-10-CM | POA: Diagnosis not present

## 2023-07-11 ENCOUNTER — Other Ambulatory Visit: Payer: Self-pay

## 2023-07-11 DIAGNOSIS — G8929 Other chronic pain: Secondary | ICD-10-CM

## 2023-07-11 NOTE — Patient Instructions (Signed)
 Visit Information  Ms. Charlet was given information about Medicaid Managed Care team care coordination services as a part of their [facility] Medicaid benefit. Juanell Buening verbally consented to engagement with the [facility] Managed Care team.   If you are experiencing a medical emergency, please call 911 or report to your local emergency department or urgent care.   If you have a non-emergency medical problem during routine business hours, please contact your provider's office and ask to speak with a nurse.   For questions related to your [facility] health plan, please call: [phone] or go here:[email or web address]  If you would like to schedule transportation through your [facility] plan, please call the following number at least 2 days in advance of your appointment: [phone].   You can also use the MTM portal or MTM mobile app to manage your rides. Reimbursement for transportation is available through [facility]! For the portal, please go to [email or web address]  Call the [facility] Crisis Line at [phone], at any time, 24 hours a day, 7 days a week. If you are in danger or need immediate medical attention call 911.  If you would like help to quit smoking, call 1-800-QUIT-NOW (201-024-3094) OR Espaol: 1-855-Djelo-Ya (8-144-664-6430) o para ms informacin haga clic aqu or Text READY to 799-599 to register via text  Ms. Batiz - following are the goals we discussed in your visit today:   Goals Addressed             This Visit's Progress    VBCI RN Care Plan   No change    Problems:  Care Coordination needs related to Dental and Vision  Goal: Over the next 30 days the Patient will demonstrate Improved health management independence as evidenced by making Dental and Vision appointments          Interventions:   Health Maintenance Interventions: Patient interviewed about adult health maintenance status including  Regular eye  checkups Regular Dental Care     Patient Self-Care Activities:  Patient will call customer service number on back of [employer] insurance card for local dentists and eyecare centers that take her insurance.   Plan:  Telephone follow up appointment with care management team member scheduled for:  two weeks.           VBCI RN Care Plan   On track    Problems:  Chronic Disease Management support and education needs related to Morbid Obesity  Goal: Over the next 30 days the Patient will verbalize understanding of plan for management of Morbid Obesity as evidenced by attending appointment with [facility] in [place] to learn of plan for gastric bypass.   Interventions:   Evaluation of current treatment plan related to obesity , Limited education about current plan for weight loss* self-management and patient's adherence to plan as established by provider. Discussed plans with patient for ongoing care management follow up and provided patient with direct contact information for care management team Encouraged attending all bariatric appointments,patient states this is her second time going through pre-surgery requirements so she is aware of importance of following the plan .   Patient Self-Care Activities:  Follow meal plan discussed at appointment on 06/28/23 at [facility] in [place].    Plan:  Telephone follow up appointment with care management team member scheduled for:  three weeks.           VBCI RN Care Plan       Problems:  Care Coordination needs related to Financial Strain  Goal: Over the next 30 days the Patient will work with Child psychotherapist to address Financial constraints related to loss of job/income related to the management of Obesity/back and knee pain as evidenced by review of electronic medical record and patient or social worker report      Interventions:   Evaluation of current treatment plan related to financial strain, [employer]  knowledge of community resource: utility assistance, internet assistance self-management and patient's adherence to plan as established by provider. Discussed plans with patient for ongoing care management follow up and provided patient with direct contact information for care management team Social Work referral for utility assistance, low-cost internet.  Patient Self-Care Activities:  Work with the Child psychotherapist to address care coordination needs and will continue to work with the clinical team to address health care and disease management related needs  Plan:  Telephone follow up appointment with care management team member scheduled for:  three weeks.               Patient verbalizes understanding of instructions and care plan provided today and agrees to view in MyChart. Active MyChart status and patient understanding of how to access instructions and care plan via MyChart confirmed with patient.     Telephone follow up appointment with Managed Medicaid care management team member scheduled qnm:[id number], July 16th at 2:15pm.   Santana Stamp BSN, CCM [place]  [facility] Population Health RN Care Manager Direct Dial : [phone]  Fax: [phone]   Following is a copy of your plan of care:  There are no care plans that you recently modified to display for this patient.

## 2023-07-11 NOTE — Patient Outreach (Signed)
 Complex Care Management   Visit Note  07/11/2023  Name:  Danielle [name] MRN: [id number] DOB: [birth year]  Situation: Referral received for Complex Care Management related to Obesity I obtained verbal consent from Patient.  Visit completed with Danielle [name]  on the phone. Main concern today is being able to pay her bills, she is behind on internet and utility bill.  She is currently seeing Bariatric surgeon to prepare for surgery, awaiting scheduling for endoscopy as ordered.   Background:   Past Medical History:  Diagnosis Date   Degenerative arthritis of knee    left    Hx of alpha thalassemia     Assessment: Patient Reported Symptoms:  Cognitive Cognitive Status: Alert and oriented to person, place, and time, Insightful and able to interpret abstract concepts, Normal speech and language skills      Neurological Neurological Review of Symptoms: No symptoms reported    HEENT HEENT Symptoms Reported: Not assessed      Cardiovascular Cardiovascular Symptoms Reported: Not assessed    Respiratory Respiratory Symptoms Reported: No symptoms reported    Endocrine Patient reports the following symptoms related to hypoglycemia or hyperglycemia : Not assessed    Gastrointestinal Gastrointestinal Symptoms Reported: Not assessed      Genitourinary Genitourinary Symptoms Reported: Not assessed    Integumentary Integumentary Symptoms Reported: No symptoms reported    Musculoskeletal Musculoskelatal Symptoms Reviewed: Other Other Musculoskeletal Symptoms: Had Left Knee Nerve Ablation on 06/22/23, recovering, rates pain of 6 after taking pain meds, patient is aware some pain is expected after surgery, feels it is improving as expected. Musculoskeletal Self-Management Outcome: 3 (uncertain)      Psychosocial Psychosocial Symptoms Reported: Not assessed            06/27/2023    3:05 PM  Depression screen PHQ 2/9  Decreased Interest 0  Down, Depressed, Hopeless 0  PHQ - 2 Score 0     There were no vitals filed for this visit.  Medications Reviewed Today   Medications were not reviewed in this encounter     Recommendation:   Specialty provider follow-up : she is awaiting appointment for endoscopy as ordered by Bariatric surgeon, she is following recommendations for meal plan as ordered.  -This RNCM made referral to [facility] Guide for resources to address difficulty paying utilities and internet.   Follow Up Plan:   Telephone follow-up three weeks.  Santana Stamp BSN, CCM   VBCI Population Health RN Care Manager Direct Dial : [phone]  Fax: [phone]

## 2023-07-12 ENCOUNTER — Telehealth: Payer: Self-pay | Admitting: *Deleted

## 2023-07-12 NOTE — Progress Notes (Signed)
 Complex Care Management Care Guide Note  07/12/2023 Name: Danielle [name] MRN: [id number] DOB: 1985/07/17  Danielle [name] is a 38 y.o. year old female who is a primary care patient of Orlando Pond, DO and is actively engaged with the care management team. I reached out to Jimya Cheadle by phone today to assist with scheduling  with the BSW.  Follow up plan: Unsuccessful telephone outreach attempt made. A HIPAA compliant phone message was left for the patient providing contact information and requesting a return call.  Harlene Satterfield  [facility] Health  Value-Based Care Institute, [facility] Guide  Direct Dial : [phone]  Fax [phone]

## 2023-07-13 DIAGNOSIS — F332 Major depressive disorder, recurrent severe without psychotic features: Secondary | ICD-10-CM | POA: Diagnosis not present

## 2023-07-16 NOTE — Progress Notes (Unsigned)
 Complex Care Management Care Guide Note  07/16/2023 Name: Danielle [name] MRN: [id number] DOB: [birth year]  Danielle [name] is a 38 y.o. year old female who is a primary care patient of Orlando Pond, DO and is actively engaged with the care management team. I reached out to Ailine Un by phone today to assist with scheduling  with the BSW.  Follow up plan: Unsuccessful telephone outreach attempt made. A HIPAA compliant phone message was left for the patient providing contact information and requesting a return call.  Harlene Satterfield  [facility] Health  Value-Based Care Institute, [facility] Guide  Direct Dial : [phone]  Fax [phone]

## 2023-07-17 DIAGNOSIS — F332 Major depressive disorder, recurrent severe without psychotic features: Secondary | ICD-10-CM | POA: Diagnosis not present

## 2023-07-17 NOTE — Progress Notes (Signed)
 Complex Care Management Care Guide Note  07/17/2023 Name: Danielle [name] MRN: [id number] DOB: December 03, 1985  Danielle [name] is a 38 y.o. year old female who is a primary care patient of Cleotilde Perkins, DO and is actively engaged with the care management team. I reached out to Saint Barthelemy Riquelme by phone today to assist with scheduling  with the BSW.  Follow up plan: Unsuccessful telephone outreach attempt made. No further outreach attempts will be made at this time to schedule with BSW. We have been unable to contact the patient to schedule additional referral for complex care management services with BSW.    Harlene Satterfield  [facility] Health  Value-Based Care Institute, [facility] Guide  Direct Dial : [phone]  Fax [phone]

## 2023-07-18 DIAGNOSIS — K449 Diaphragmatic hernia without obstruction or gangrene: Secondary | ICD-10-CM | POA: Diagnosis not present

## 2023-07-21 DIAGNOSIS — F331 Major depressive disorder, recurrent, moderate: Secondary | ICD-10-CM | POA: Diagnosis not present

## 2023-07-23 DIAGNOSIS — F331 Major depressive disorder, recurrent, moderate: Secondary | ICD-10-CM | POA: Diagnosis not present

## 2023-07-27 DIAGNOSIS — F411 Generalized anxiety disorder: Secondary | ICD-10-CM | POA: Diagnosis not present

## 2023-07-28 DIAGNOSIS — Z419 Encounter for procedure for purposes other than remedying health state, unspecified: Secondary | ICD-10-CM | POA: Diagnosis not present

## 2023-08-01 ENCOUNTER — Other Ambulatory Visit: Payer: Self-pay

## 2023-08-01 NOTE — Patient Instructions (Signed)
 Affie Korzeniewski - I am sorry I was unable to reach you today for our scheduled appointment. I work with Cleotilde Perkins, DO and am calling to support your healthcare needs. Please contact me at [phone] at your earliest convenience. I look forward to speaking with you soon.   Thank you,  Wilbert Diver RN, BSN, [facility]  [facility], [facility] Health    Care Coordinator Phone: [phone]     (signature)

## 2023-08-05 DIAGNOSIS — F411 Generalized anxiety disorder: Secondary | ICD-10-CM | POA: Diagnosis not present

## 2023-08-08 DIAGNOSIS — M1712 Unilateral primary osteoarthritis, left knee: Secondary | ICD-10-CM | POA: Diagnosis not present

## 2023-08-08 DIAGNOSIS — K449 Diaphragmatic hernia without obstruction or gangrene: Secondary | ICD-10-CM | POA: Diagnosis not present

## 2023-08-08 DIAGNOSIS — Z6841 Body Mass Index (BMI) 40.0 and over, adult: Secondary | ICD-10-CM | POA: Diagnosis not present

## 2023-08-08 DIAGNOSIS — Z903 Acquired absence of stomach [part of]: Secondary | ICD-10-CM | POA: Diagnosis not present

## 2023-08-08 DIAGNOSIS — K219 Gastro-esophageal reflux disease without esophagitis: Secondary | ICD-10-CM | POA: Diagnosis not present

## 2023-08-09 DIAGNOSIS — K3189 Other diseases of stomach and duodenum: Secondary | ICD-10-CM | POA: Diagnosis not present

## 2023-08-09 DIAGNOSIS — F332 Major depressive disorder, recurrent severe without psychotic features: Secondary | ICD-10-CM | POA: Diagnosis not present

## 2023-08-14 DIAGNOSIS — F332 Major depressive disorder, recurrent severe without psychotic features: Secondary | ICD-10-CM | POA: Diagnosis not present

## 2023-08-18 DIAGNOSIS — F332 Major depressive disorder, recurrent severe without psychotic features: Secondary | ICD-10-CM | POA: Diagnosis not present

## 2023-08-21 DIAGNOSIS — F332 Major depressive disorder, recurrent severe without psychotic features: Secondary | ICD-10-CM | POA: Diagnosis not present

## 2023-08-22 DIAGNOSIS — F332 Major depressive disorder, recurrent severe without psychotic features: Secondary | ICD-10-CM | POA: Diagnosis not present

## 2023-08-24 DIAGNOSIS — F332 Major depressive disorder, recurrent severe without psychotic features: Secondary | ICD-10-CM | POA: Diagnosis not present

## 2023-08-27 DIAGNOSIS — F332 Major depressive disorder, recurrent severe without psychotic features: Secondary | ICD-10-CM | POA: Diagnosis not present

## 2023-08-28 DIAGNOSIS — Z419 Encounter for procedure for purposes other than remedying health state, unspecified: Secondary | ICD-10-CM | POA: Diagnosis not present

## 2023-08-29 DIAGNOSIS — F332 Major depressive disorder, recurrent severe without psychotic features: Secondary | ICD-10-CM | POA: Diagnosis not present

## 2023-08-30 DIAGNOSIS — F332 Major depressive disorder, recurrent severe without psychotic features: Secondary | ICD-10-CM | POA: Diagnosis not present

## 2023-09-01 DIAGNOSIS — F332 Major depressive disorder, recurrent severe without psychotic features: Secondary | ICD-10-CM | POA: Diagnosis not present

## 2023-09-04 DIAGNOSIS — F332 Major depressive disorder, recurrent severe without psychotic features: Secondary | ICD-10-CM | POA: Diagnosis not present

## 2023-09-06 DIAGNOSIS — F332 Major depressive disorder, recurrent severe without psychotic features: Secondary | ICD-10-CM | POA: Diagnosis not present

## 2023-09-06 DIAGNOSIS — Z6841 Body Mass Index (BMI) 40.0 and over, adult: Secondary | ICD-10-CM | POA: Diagnosis not present

## 2023-09-06 DIAGNOSIS — M1712 Unilateral primary osteoarthritis, left knee: Secondary | ICD-10-CM | POA: Diagnosis not present

## 2023-09-06 DIAGNOSIS — K449 Diaphragmatic hernia without obstruction or gangrene: Secondary | ICD-10-CM | POA: Diagnosis not present

## 2023-09-06 DIAGNOSIS — Z903 Acquired absence of stomach [part of]: Secondary | ICD-10-CM | POA: Diagnosis not present

## 2023-09-07 DIAGNOSIS — F332 Major depressive disorder, recurrent severe without psychotic features: Secondary | ICD-10-CM | POA: Diagnosis not present

## 2023-09-11 ENCOUNTER — Telehealth: Payer: Self-pay

## 2023-09-11 DIAGNOSIS — F332 Major depressive disorder, recurrent severe without psychotic features: Secondary | ICD-10-CM | POA: Diagnosis not present

## 2023-09-11 NOTE — Patient Instructions (Signed)
 Lillith Abel - I am sorry I was unable to reach you today for our scheduled appointment. I work with Cleotilde Perkins, DO and am calling to support your healthcare needs. Please contact me at [phone] at your earliest convenience. I look forward to speaking with you soon.   Thank you,  Santana Stamp BSN, CCM [place]  VBCI Population Health RN Care Manager Direct Dial : [phone]  Fax: [phone]

## 2023-09-12 DIAGNOSIS — F332 Major depressive disorder, recurrent severe without psychotic features: Secondary | ICD-10-CM | POA: Diagnosis not present

## 2023-09-14 DIAGNOSIS — F332 Major depressive disorder, recurrent severe without psychotic features: Secondary | ICD-10-CM | POA: Diagnosis not present

## 2023-09-18 ENCOUNTER — Telehealth: Payer: Self-pay

## 2023-09-18 NOTE — Patient Instructions (Signed)
 Danielle [name] - I am sorry I was unable to reach you today for our scheduled appointment. I work with Cleotilde Perkins, DO and am calling to support your healthcare needs. Please contact me at [phone] at your earliest convenience. I look forward to speaking with you soon.   Thank you,  Santana Stamp BSN, CCM [place]  VBCI Population Health RN Care Manager Direct Dial : [phone]  Fax: [phone]

## 2023-09-22 DIAGNOSIS — H5213 Myopia, bilateral: Secondary | ICD-10-CM | POA: Diagnosis not present

## 2023-09-26 ENCOUNTER — Other Ambulatory Visit: Payer: Self-pay | Admitting: Medical Genetics

## 2023-09-26 DIAGNOSIS — Z903 Acquired absence of stomach [part of]: Secondary | ICD-10-CM | POA: Diagnosis not present

## 2023-09-26 DIAGNOSIS — M1712 Unilateral primary osteoarthritis, left knee: Secondary | ICD-10-CM | POA: Diagnosis not present

## 2023-09-26 DIAGNOSIS — Z6841 Body Mass Index (BMI) 40.0 and over, adult: Secondary | ICD-10-CM | POA: Diagnosis not present

## 2023-09-26 DIAGNOSIS — K449 Diaphragmatic hernia without obstruction or gangrene: Secondary | ICD-10-CM | POA: Diagnosis not present

## 2023-09-27 DIAGNOSIS — F54 Psychological and behavioral factors associated with disorders or diseases classified elsewhere: Secondary | ICD-10-CM | POA: Diagnosis not present

## 2023-09-27 DIAGNOSIS — Z7189 Other specified counseling: Secondary | ICD-10-CM | POA: Diagnosis not present

## 2023-09-28 DIAGNOSIS — Z419 Encounter for procedure for purposes other than remedying health state, unspecified: Secondary | ICD-10-CM | POA: Diagnosis not present

## 2023-10-03 DIAGNOSIS — E611 Iron deficiency: Secondary | ICD-10-CM | POA: Diagnosis not present

## 2023-10-03 DIAGNOSIS — Z6841 Body Mass Index (BMI) 40.0 and over, adult: Secondary | ICD-10-CM | POA: Diagnosis not present

## 2023-10-03 DIAGNOSIS — K219 Gastro-esophageal reflux disease without esophagitis: Secondary | ICD-10-CM | POA: Diagnosis not present

## 2023-10-03 DIAGNOSIS — Z903 Acquired absence of stomach [part of]: Secondary | ICD-10-CM | POA: Diagnosis not present

## 2023-10-03 DIAGNOSIS — K449 Diaphragmatic hernia without obstruction or gangrene: Secondary | ICD-10-CM | POA: Diagnosis not present

## 2023-10-03 DIAGNOSIS — E559 Vitamin D deficiency, unspecified: Secondary | ICD-10-CM | POA: Diagnosis not present

## 2023-10-03 DIAGNOSIS — M1712 Unilateral primary osteoarthritis, left knee: Secondary | ICD-10-CM | POA: Diagnosis not present

## 2023-10-04 ENCOUNTER — Other Ambulatory Visit: Payer: Self-pay

## 2023-10-04 NOTE — Patient Instructions (Addendum)
 Visit Information  Danielle [name] was given information about Medicaid Managed Care team care coordination services as a part of their [facility] Medicaid benefit.   If you would like to schedule transportation through your [facility] plan, please call the following number at least 2 days in advance of your appointment: [phone].   You can also use the MTM portal or MTM mobile app to manage your rides. Reimbursement for transportation is available through [facility]! For the portal, please go to [email or web address]  Call the [facility] Crisis Line at [phone], at any time, 24 hours a day, 7 days a week. If you are in danger or need immediate medical attention call 911.    Patient verbalizes understanding of instructions and care plan provided today and agrees to view in MyChart. Active MyChart status and patient understanding of how to access instructions and care plan via MyChart confirmed with patient.     Telephone follow up appointment with Managed Medicaid care management team member scheduled qnm:Uylmdijb, October 2nd 2:45pm.    Following is a copy of your plan of care:  There are no care plans that you recently modified to display for this patient.

## 2023-10-04 NOTE — Patient Outreach (Addendum)
 Complex Care Management   Visit Note  10/04/2023  Name:  Danielle [name] MRN: [id number] DOB: May 22, 1985  Situation: Referral received for Complex Care Management related to Financial difficulty, Obesity.  I obtained verbal consent from Patient.  Visit completed with Patient  on the phone  Background:   Past Medical History:  Diagnosis Date   Degenerative arthritis of knee    left    Hx of alpha thalassemia     Assessment: Patient Reported Symptoms:  Cognitive Cognitive Status: Alert and oriented to person, place, and time, Insightful and able to interpret abstract concepts, Normal speech and language skills      Neurological Neurological Review of Symptoms: No symptoms reported    HEENT HEENT Symptoms Reported: Not assessed      Cardiovascular Cardiovascular Symptoms Reported: No symptoms reported    Respiratory Respiratory Symptoms Reported: No symptoms reported    Endocrine Endocrine Symptoms Reported: Not assessed    Gastrointestinal Gastrointestinal Symptoms Reported: No symptoms reported      Genitourinary Genitourinary Symptoms Reported: No symptoms reported    Integumentary Integumentary Symptoms Reported: No symptoms reported    Musculoskeletal Musculoskelatal Symptoms Reviewed: Difficulty walking Other Musculoskeletal Symptoms: Injured right posterior ankle about 1.5 years ago, chronic pain, rates pain of 7/10.  She will make appointment with Ortho after she completes and heals from [facility] Bypass surgery soon to be scheduled.        Psychosocial Psychosocial Symptoms Reported: Not assessed          10/04/2023    PHQ2-9 Depression Screening   Little interest or pleasure in doing things    Feeling down, depressed, or hopeless    PHQ-2 - Total Score    Trouble falling or staying asleep, or sleeping too much    Feeling tired or having little energy    Poor appetite or overeating     Feeling bad about yourself - or that you are a failure or have let  yourself or your family down    Trouble concentrating on things, such as reading the newspaper or watching television    Moving or speaking so slowly that other people could have noticed.  Or the opposite - being so fidgety or restless that you have been moving around a lot more than usual    Thoughts that you would be better off dead, or hurting yourself in some way    PHQ2-9 Total Score    If you checked off any problems, how difficult have these problems made it for you to do your work, take care of things at home, or get along with other people    Depression Interventions/Treatment      There were no vitals filed for this visit.  Medications Reviewed Today   Medications were not reviewed in this encounter     Recommendation:   Specialty provider follow-up Bariatric surgeon on 10/29/23 for gastric bypass plan/surgery date. Referral to: Pharmacy for no/low cost iron  and vitamin D3 as prescribed by Bariatric surgeon Patient will follow up with Ortho to assess left heel pain after she has bariatric surgery, will continue to take pain medication as prescribed to manage heel pain.   Follow Up Plan:   Telephone follow-up two weeks.  Santana Stamp BSN, CCM [place]  [facility] RN Care Manager Direct Dial : [phone]  Fax: [phone]

## 2023-10-08 DIAGNOSIS — F54 Psychological and behavioral factors associated with disorders or diseases classified elsewhere: Secondary | ICD-10-CM | POA: Diagnosis not present

## 2023-10-08 DIAGNOSIS — Z6841 Body Mass Index (BMI) 40.0 and over, adult: Secondary | ICD-10-CM | POA: Diagnosis not present

## 2023-10-15 ENCOUNTER — Telehealth: Payer: Self-pay | Admitting: Pharmacist

## 2023-10-15 NOTE — Telephone Encounter (Signed)
 Patient contacted for follow-up of issues with affording Integra Plus capsules and Cholecalciferol 5000 UT her GI surgeon prescribed for her upcoming gastric bypass.   Since last contact patient reports those medications would cost her $50. Discussed buying prenatal vitamin + iron  OTC as well as Cholecalciferol (5000 UT or 2000 UT x 2) OTC and that medications OTC would be around ~$30. Patient amenable to going to the pharmacy and looking for those options instead. Discussed that prenatal + iron  would provide the necessary components of Integra.   Total time with patient call and documentation of interaction: 7 minutes.

## 2023-10-15 NOTE — Telephone Encounter (Signed)
 Reviewed and agree with Dr Rennis plan.

## 2023-10-18 ENCOUNTER — Telehealth: Payer: Self-pay

## 2023-10-28 DIAGNOSIS — Z419 Encounter for procedure for purposes other than remedying health state, unspecified: Secondary | ICD-10-CM | POA: Diagnosis not present

## 2023-11-02 DIAGNOSIS — M47816 Spondylosis without myelopathy or radiculopathy, lumbar region: Secondary | ICD-10-CM | POA: Diagnosis not present

## 2023-11-02 DIAGNOSIS — M79671 Pain in right foot: Secondary | ICD-10-CM | POA: Diagnosis not present

## 2023-11-02 DIAGNOSIS — M1712 Unilateral primary osteoarthritis, left knee: Secondary | ICD-10-CM | POA: Diagnosis not present

## 2023-11-02 DIAGNOSIS — G8929 Other chronic pain: Secondary | ICD-10-CM | POA: Diagnosis not present

## 2023-11-13 ENCOUNTER — Other Ambulatory Visit

## 2023-11-14 ENCOUNTER — Telehealth: Payer: Self-pay

## 2023-11-14 NOTE — Patient Instructions (Incomplete)
 Visit Information  Ms. Aumiller was given information about Medicaid Managed Care team care coordination services as a part of their [facility] Medicaid benefit.   If you would like to schedule transportation through your [facility] plan, please call the following number at least 2 days in advance of your appointment: [phone].   You can also use the MTM portal or MTM mobile app to manage your rides. Reimbursement for transportation is available through [facility]! For the portal, please go to [email or web address]  Call the [facility] Crisis Line at [phone], at any time, 24 hours a day, 7 days a week. If you are in danger or need immediate medical attention call 911.  Please see education materials related to *** provided {MMEDCHOICES:24216}  {CCM PT INSTRUCTIONS:22237}  {MM FOLLOW UP EOJW:75841}  SIG***  Following is a copy of your plan of care:  There are no care plans that you recently modified to display for this patient.

## 2023-11-14 NOTE — Progress Notes (Signed)
 Complex Care Management Note Care Guide Note  11/14/2023 Name: Danielle [name] MRN: [id number] DOB: 1985-01-24  Danielle [name] is a 38 y.o. year old female who is a primary care patient of Cleotilde Perkins, DO . The community resource team was consulted for assistance with Transportation Needs , Food Insecurity, and Financial Difficulties related to utilities, housing.  SDOH screenings and interventions completed:  Yes  Social Drivers of Health From This Encounter   Food Insecurity: Food Insecurity Present (11/14/2023)   Hunger Vital Sign    Worried About Running Out of Food in the Last Year: Sometimes true    Ran Out of Food in the Last Year: Sometimes true  Housing: High Risk (11/14/2023)   Housing Stability Vital Sign    Unable to Pay for Housing in the Last Year: Yes    Number of Times Moved in the Last Year: 0    Homeless in the Last Year: No  Financial Resource Strain: Medium Risk (11/14/2023)   Overall Financial Resource Strain (CARDIA)    Difficulty of Paying Living Expenses: Somewhat hard  Transportation Needs: No Transportation Needs (11/14/2023)   PRAPARE - Administrator, Civil Service (Medical): No    Lack of Transportation (Non-Medical): No  Utilities: At Risk (11/14/2023)   Utilities    Threatened with loss of utilities: Yes    SDOH Interventions Today    Flowsheet Row Most Recent Value  SDOH Interventions   Food Insecurity Interventions Other (Comment)  [Emailed [employer], Pharmacist, Community and [employer] Spendables benefit.]  Housing Interventions --  [employer] information for [employer] Housing counseling program.]  Transportation Interventions Other (Comment)  [Emailed information for [employer] services.]  Utilities Interventions Other (Comment)  [Emailed information for  [employer] charity, [employer].]     Care guide performed the following  interventions: Patient provided with information about care guide support team and interviewed to confirm resource needs.  Follow Up Plan:  I will call patient later in the week to ensure she received emailed resources.  Encounter Outcome:  Patient Visit Completed  Danielle [name] [name]  [facility] Resource Care Guide Direct Dial : [phone]  Fax: [phone] Website: [email or web address]

## 2023-11-16 ENCOUNTER — Telehealth: Payer: Self-pay

## 2023-11-16 NOTE — Patient Outreach (Signed)
 Complex Care Management   Visit Note  11/16/2023  Name:  Danielle [name] MRN: [id number] DOB: December 15, 1985  Situation: Referral received for Complex Care Management related to Morbid Obesity, Financial Strain. I obtained verbal consent from Patient.  Visit completed with Danielle [name]  on the phone.  She is preparing for Gastric Bypass surgery, needs assistance with resources related to financial strain for utilities/food/transportation/internet.  Background:   Past Medical History:  Diagnosis Date   Degenerative arthritis of knee    left    Hx of alpha thalassemia     Assessment: Patient Reported Symptoms:  Cognitive Cognitive Status: Alert and oriented to person, place, and time, Insightful and able to interpret abstract concepts, Normal speech and language skills      Neurological Neurological Review of Symptoms: Not assessed    HEENT HEENT Symptoms Reported: Not assessed      Cardiovascular Cardiovascular Symptoms Reported: No symptoms reported    Respiratory Respiratory Symptoms Reported: No symptoms reported    Endocrine Endocrine Symptoms Reported: Not assessed Is patient diabetic?: No    Gastrointestinal Gastrointestinal Symptoms Reported: Not assessed      Genitourinary Genitourinary Symptoms Reported: Not assessed    Integumentary Integumentary Symptoms Reported: Not assessed    Musculoskeletal Musculoskelatal Symptoms Reviewed: Back pain Other Musculoskeletal Symptoms: States my back went out, using ice/heat therapy to treat pain.        Psychosocial Psychosocial Symptoms Reported: Not assessed          11/16/2023    PHQ2-9 Depression Screening   Little interest or pleasure in doing things    Feeling down, depressed, or hopeless    PHQ-2 - Total Score    Trouble falling or staying asleep, or sleeping too much    Feeling tired or having little energy    Poor appetite or overeating     Feeling bad about yourself - or that you are a failure or have  let yourself or your family down    Trouble concentrating on things, such as reading the newspaper or watching television    Moving or speaking so slowly that other people could have noticed.  Or the opposite - being so fidgety or restless that you have been moving around a lot more than usual    Thoughts that you would be better off dead, or hurting yourself in some way    PHQ2-9 Total Score    If you checked off any problems, how difficult have these problems made it for you to do your work, take care of things at home, or get along with other people    Depression Interventions/Treatment      There were no vitals filed for this visit.  MEDICATIONS: Patient continues to complain she is having difficulty paying for OTC vitamins that were ordered by Bariatric MD.  Referral was sent to [facility] where recommendations were given for best price.  Today, this RNCM sent referral to Care Guide for other SDOH resources that may help offset cost of medications.    Recommendation:   Referral to: Care Guide for resources related to financial strain  Follow Up Plan:   Telephone follow-up 11/27/23  Santana Stamp BSN, CCM [place]  [facility] RN Care Manager Direct Dial : [phone]  Fax: [phone]

## 2023-11-16 NOTE — Progress Notes (Signed)
 Complex Care Management Note Care Guide Note  11/16/2023 Name: Danielle [name] MRN: [id number] DOB: February 28, 1985   Complex Care Management Outreach Attempts: A second unsuccessful outreach was attempted today to offer the patient with information about available complex care management services.  Follow Up Plan:  Additional outreach attempts will be made to offer the patient complex care management information and services.   Encounter Outcome:  No Answer  Gotham Raden [name]  [facility] Resource Care Guide Direct Dial : [phone]  Fax: [phone] Website: [email or web address]

## 2023-11-19 ENCOUNTER — Telehealth: Payer: Self-pay

## 2023-11-19 DIAGNOSIS — K219 Gastro-esophageal reflux disease without esophagitis: Secondary | ICD-10-CM | POA: Diagnosis not present

## 2023-11-19 DIAGNOSIS — Z903 Acquired absence of stomach [part of]: Secondary | ICD-10-CM | POA: Diagnosis not present

## 2023-11-19 DIAGNOSIS — M5440 Lumbago with sciatica, unspecified side: Secondary | ICD-10-CM | POA: Diagnosis not present

## 2023-11-19 DIAGNOSIS — M1712 Unilateral primary osteoarthritis, left knee: Secondary | ICD-10-CM | POA: Diagnosis not present

## 2023-11-19 DIAGNOSIS — E611 Iron deficiency: Secondary | ICD-10-CM | POA: Diagnosis not present

## 2023-11-19 DIAGNOSIS — E66813 Obesity, class 3: Secondary | ICD-10-CM | POA: Diagnosis not present

## 2023-11-19 DIAGNOSIS — K449 Diaphragmatic hernia without obstruction or gangrene: Secondary | ICD-10-CM | POA: Diagnosis not present

## 2023-11-19 DIAGNOSIS — Z6841 Body Mass Index (BMI) 40.0 and over, adult: Secondary | ICD-10-CM | POA: Diagnosis not present

## 2023-11-19 DIAGNOSIS — E559 Vitamin D deficiency, unspecified: Secondary | ICD-10-CM | POA: Diagnosis not present

## 2023-11-19 NOTE — Progress Notes (Signed)
 Complex Care Management Note Care Guide Note  11/19/2023 Name: Danielle [name] MRN: [id number] DOB: 05/18/1985  Danielle [name] is a 38 y.o. year old female who is a primary care patient of Cleotilde Perkins, DO . The community resource team was consulted for assistance with Transportation Needs , Food Insecurity, and housing, utilities.  SDOH screenings and interventions completed:  Yes     SDOH Interventions Today    Flowsheet Row Most Recent Value  SDOH Interventions   Food Insecurity Interventions Other (Comment)  [Patient received emailed resources.]  Housing Interventions Other (Comment)  [Patient received emailed resources.]  Transportation Interventions Other (Comment)  [Patient received emailed resources.]  Utilities Interventions Other (Comment)  [Patient received emailed resources.]     Care guide performed the following interventions: Follow up call placed to the patient to discuss status of referral Patient stated she received all emailed resources sent 11/14/23. No further assistance needed at this time.  Follow Up Plan:  No further follow up planned at this time. The patient has been provided with needed resources.  Encounter Outcome:  Patient Visit Completed  Danielle [name] [name]  [facility] Resource Care Guide Direct Dial : [phone]  Fax: [phone] Website: [email or web address]

## 2023-11-20 DIAGNOSIS — Z903 Acquired absence of stomach [part of]: Secondary | ICD-10-CM | POA: Diagnosis not present

## 2023-11-20 DIAGNOSIS — E66813 Obesity, class 3: Secondary | ICD-10-CM | POA: Diagnosis not present

## 2023-11-20 DIAGNOSIS — M5416 Radiculopathy, lumbar region: Secondary | ICD-10-CM | POA: Diagnosis not present

## 2023-11-20 DIAGNOSIS — G8929 Other chronic pain: Secondary | ICD-10-CM | POA: Diagnosis not present

## 2023-11-20 DIAGNOSIS — G8918 Other acute postprocedural pain: Secondary | ICD-10-CM | POA: Diagnosis not present

## 2023-11-20 DIAGNOSIS — D509 Iron deficiency anemia, unspecified: Secondary | ICD-10-CM | POA: Diagnosis not present

## 2023-11-20 DIAGNOSIS — E559 Vitamin D deficiency, unspecified: Secondary | ICD-10-CM | POA: Diagnosis not present

## 2023-11-20 DIAGNOSIS — R635 Abnormal weight gain: Secondary | ICD-10-CM | POA: Diagnosis not present

## 2023-11-20 DIAGNOSIS — R160 Hepatomegaly, not elsewhere classified: Secondary | ICD-10-CM | POA: Diagnosis not present

## 2023-11-20 DIAGNOSIS — M1712 Unilateral primary osteoarthritis, left knee: Secondary | ICD-10-CM | POA: Diagnosis not present

## 2023-11-20 DIAGNOSIS — Z6841 Body Mass Index (BMI) 40.0 and over, adult: Secondary | ICD-10-CM | POA: Diagnosis not present

## 2023-11-20 DIAGNOSIS — K219 Gastro-esophageal reflux disease without esophagitis: Secondary | ICD-10-CM | POA: Diagnosis not present

## 2023-11-20 DIAGNOSIS — G7089 Other specified myoneural disorders: Secondary | ICD-10-CM | POA: Diagnosis not present

## 2023-11-20 DIAGNOSIS — K660 Peritoneal adhesions (postprocedural) (postinfection): Secondary | ICD-10-CM | POA: Diagnosis not present

## 2023-11-20 DIAGNOSIS — E119 Type 2 diabetes mellitus without complications: Secondary | ICD-10-CM | POA: Diagnosis not present

## 2023-11-20 DIAGNOSIS — K449 Diaphragmatic hernia without obstruction or gangrene: Secondary | ICD-10-CM | POA: Diagnosis not present

## 2023-11-20 DIAGNOSIS — D569 Thalassemia, unspecified: Secondary | ICD-10-CM | POA: Diagnosis not present

## 2023-11-20 DIAGNOSIS — M4696 Unspecified inflammatory spondylopathy, lumbar region: Secondary | ICD-10-CM | POA: Diagnosis not present

## 2023-11-20 DIAGNOSIS — M47816 Spondylosis without myelopathy or radiculopathy, lumbar region: Secondary | ICD-10-CM | POA: Diagnosis not present

## 2023-11-27 ENCOUNTER — Telehealth: Payer: Self-pay

## 2023-11-28 DIAGNOSIS — Z419 Encounter for procedure for purposes other than remedying health state, unspecified: Secondary | ICD-10-CM | POA: Diagnosis not present

## 2024-01-22 ENCOUNTER — Telehealth: Payer: Self-pay
